# Patient Record
Sex: Female | Born: 1950 | Race: Black or African American | Hispanic: No | Smoking: Former smoker
Health system: Southern US, Community
[De-identification: ages and names within clinical notes are randomized; demographics above are authoritative.]

## PROBLEM LIST (undated history)

## (undated) ENCOUNTER — Emergency Department (HOSPITAL_COMMUNITY): Admission: EM | Payer: Self-pay | Source: Home / Self Care

## (undated) DIAGNOSIS — E7800 Pure hypercholesterolemia, unspecified: Secondary | ICD-10-CM

## (undated) DIAGNOSIS — I10 Essential (primary) hypertension: Secondary | ICD-10-CM

## (undated) DIAGNOSIS — M549 Dorsalgia, unspecified: Secondary | ICD-10-CM

## (undated) DIAGNOSIS — E079 Disorder of thyroid, unspecified: Secondary | ICD-10-CM

## (undated) DIAGNOSIS — M1990 Unspecified osteoarthritis, unspecified site: Secondary | ICD-10-CM

## (undated) DIAGNOSIS — O009 Unspecified ectopic pregnancy without intrauterine pregnancy: Secondary | ICD-10-CM

## (undated) HISTORY — PX: UNILATERAL SALPINGECTOMY: SHX6160

## (undated) HISTORY — PX: BREAST BIOPSY: SHX20

## (undated) HISTORY — PX: ECTOPIC PREGNANCY SURGERY: SHX613

## (undated) HISTORY — PX: TUBAL LIGATION: SHX77

---

## 1998-03-02 ENCOUNTER — Other Ambulatory Visit: Admission: RE | Admit: 1998-03-02 | Discharge: 1998-03-02 | Payer: Self-pay | Admitting: Internal Medicine

## 1998-05-08 ENCOUNTER — Other Ambulatory Visit: Admission: RE | Admit: 1998-05-08 | Discharge: 1998-05-08 | Payer: Self-pay | Admitting: Family Medicine

## 1998-05-16 ENCOUNTER — Ambulatory Visit (HOSPITAL_COMMUNITY): Admission: RE | Admit: 1998-05-16 | Discharge: 1998-05-16 | Payer: Self-pay | Admitting: Family Medicine

## 2003-06-07 ENCOUNTER — Ambulatory Visit (HOSPITAL_COMMUNITY): Admission: RE | Admit: 2003-06-07 | Discharge: 2003-06-07 | Payer: Self-pay | Admitting: General Practice

## 2003-06-07 ENCOUNTER — Encounter: Payer: Self-pay | Admitting: General Practice

## 2003-08-23 ENCOUNTER — Other Ambulatory Visit: Admission: RE | Admit: 2003-08-23 | Discharge: 2003-08-23 | Payer: Self-pay | Admitting: Internal Medicine

## 2005-04-24 ENCOUNTER — Ambulatory Visit: Payer: Self-pay | Admitting: Internal Medicine

## 2006-07-21 ENCOUNTER — Emergency Department (HOSPITAL_COMMUNITY): Admission: EM | Admit: 2006-07-21 | Discharge: 2006-07-21 | Payer: Self-pay | Admitting: Emergency Medicine

## 2007-06-11 ENCOUNTER — Encounter: Admission: RE | Admit: 2007-06-11 | Discharge: 2007-06-11 | Payer: Self-pay | Admitting: General Practice

## 2007-07-13 DIAGNOSIS — I10 Essential (primary) hypertension: Secondary | ICD-10-CM | POA: Insufficient documentation

## 2008-02-23 ENCOUNTER — Ambulatory Visit: Payer: Self-pay | Admitting: Obstetrics and Gynecology

## 2009-07-06 ENCOUNTER — Emergency Department (HOSPITAL_COMMUNITY): Admission: EM | Admit: 2009-07-06 | Discharge: 2009-07-06 | Payer: Self-pay | Admitting: Emergency Medicine

## 2011-02-07 LAB — POCT PREGNANCY, URINE: Preg Test, Ur: NEGATIVE

## 2011-02-07 LAB — POCT URINALYSIS DIP (DEVICE)
Specific Gravity, Urine: 1.025 (ref 1.005–1.030)
Urobilinogen, UA: 0.2 mg/dL (ref 0.0–1.0)

## 2011-02-07 LAB — GC/CHLAMYDIA PROBE AMP, GENITAL: Chlamydia, DNA Probe: NEGATIVE

## 2011-03-18 NOTE — Group Therapy Note (Signed)
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  I have talked to her about  the risk.  She started on folic acid on her own and I told her we would  defer this treatment, have her come back for a Pap smear here in 6  months.  If it is still CIN-1, then consider maybe cryosurgery, but I am  hoping that it would resolve by that time.  She seems to agree with this  plan and I am discharging her to come back in 6 months for Pap smear for  CIN-1.           ______________________________  Argentina Donovan, MD     PR/MEDQ  D:  02/23/2008  T:  02/23/2008  Job:  [id number]

## 2013-06-01 ENCOUNTER — Emergency Department (HOSPITAL_COMMUNITY): Payer: Self-pay

## 2013-06-01 ENCOUNTER — Emergency Department (HOSPITAL_COMMUNITY)
Admission: EM | Admit: 2013-06-01 | Discharge: 2013-06-01 | Disposition: A | Discharge status: Home / Self Care | Payer: No Typology Code available for payment source | Attending: Emergency Medicine | Admitting: Emergency Medicine

## 2013-06-01 ENCOUNTER — Encounter (HOSPITAL_COMMUNITY): Payer: Self-pay | Admitting: Emergency Medicine

## 2013-06-01 DIAGNOSIS — Y929 Unspecified place or not applicable: Secondary | ICD-10-CM | POA: Insufficient documentation

## 2013-06-01 DIAGNOSIS — M5431 Sciatica, right side: Secondary | ICD-10-CM

## 2013-06-01 DIAGNOSIS — M62830 Muscle spasm of back: Secondary | ICD-10-CM

## 2013-06-01 DIAGNOSIS — Y9389 Activity, other specified: Secondary | ICD-10-CM | POA: Insufficient documentation

## 2013-06-01 DIAGNOSIS — Z87891 Personal history of nicotine dependence: Secondary | ICD-10-CM | POA: Insufficient documentation

## 2013-06-01 DIAGNOSIS — S335XXA Sprain of ligaments of lumbar spine, initial encounter: Secondary | ICD-10-CM | POA: Insufficient documentation

## 2013-06-01 DIAGNOSIS — Z791 Long term (current) use of non-steroidal anti-inflammatories (NSAID): Secondary | ICD-10-CM | POA: Insufficient documentation

## 2013-06-01 DIAGNOSIS — M545 Low back pain: Secondary | ICD-10-CM

## 2013-06-01 DIAGNOSIS — Z79899 Other long term (current) drug therapy: Secondary | ICD-10-CM | POA: Insufficient documentation

## 2013-06-01 DIAGNOSIS — M5430 Sciatica, unspecified side: Secondary | ICD-10-CM | POA: Insufficient documentation

## 2013-06-01 DIAGNOSIS — I10 Essential (primary) hypertension: Secondary | ICD-10-CM | POA: Insufficient documentation

## 2013-06-01 DIAGNOSIS — M5380 Other specified dorsopathies, site unspecified: Secondary | ICD-10-CM | POA: Insufficient documentation

## 2013-06-01 DIAGNOSIS — S39012A Strain of muscle, fascia and tendon of lower back, initial encounter: Secondary | ICD-10-CM

## 2013-06-01 DIAGNOSIS — X503XXA Overexertion from repetitive movements, initial encounter: Secondary | ICD-10-CM | POA: Insufficient documentation

## 2013-06-01 DIAGNOSIS — E079 Disorder of thyroid, unspecified: Secondary | ICD-10-CM | POA: Insufficient documentation

## 2013-06-01 DIAGNOSIS — E7800 Pure hypercholesterolemia, unspecified: Secondary | ICD-10-CM | POA: Insufficient documentation

## 2013-06-01 HISTORY — DX: Disorder of thyroid, unspecified: E07.9

## 2013-06-01 HISTORY — DX: Essential (primary) hypertension: I10

## 2013-06-01 HISTORY — DX: Pure hypercholesterolemia, unspecified: E78.00

## 2013-06-01 MED ORDER — METHOCARBAMOL 750 MG PO TABS: 750.0000 mg | ORAL_TABLET | Freq: Four times a day (QID) | ORAL | Status: DC | PRN

## 2013-06-01 MED ORDER — OXYCODONE-ACETAMINOPHEN 5-325 MG PO TABS: 1.0000 | ORAL_TABLET | ORAL | Status: DC | PRN

## 2013-06-01 MED ORDER — PROMETHAZINE HCL 25 MG PO TABS: 25.0000 mg | ORAL_TABLET | Freq: Four times a day (QID) | ORAL | Status: DC | PRN

## 2013-06-01 NOTE — ED Notes (Signed)
Pt reports picking up her daughter from the floor a couple of days ago and injuring her lower back. States that lower back/sacral region sore, throbbing with spasms. Pt also reports intermittent bilateral leg pain for several months. States pain is sharp and shooting in sensation.

## 2013-06-01 NOTE — ED Notes (Signed)
PT. REPORTS LOWER BACK MUSCLE PAIN AND RIGHT UPPER THIGH PAIN ONSET 2 DAYS AGO AFTER HELPING SOMEBODY GET UP . RESPIRATIONS UNLABORED , AMBULATORY , NO URINARY SYMPTOMS.

## 2013-06-01 NOTE — ED Provider Notes (Signed)
CSN: [id number]     Arrival date & time 06/01/13  1922 History  This chart was scribed for Dierdre Forth, PA-C, working with Loren Racer, MD, by Ardelia Mems ED Scribe. This patient was seen in room TR07C/TR07C and the patient's care was started at 8:55 PM.   First MD Initiated Contact with Patient 06/01/13 2044     Chief Complaint  Patient presents with  . Back Pain    The history is provided by the patient. No language interpreter was used.   HPI Comments: Kimberly Blanchard is a 62 y.o. Female with a history of arthritis, hypertension, hypercholesteremia and hypothyroidism who presents to the Emergency Department complaining of constant, moderate lower back pain onset 2 days ago. She also states that she has pain in her buttocks. She states that her granddaughter fell 2 days ago, and she tried to help her daughter up when her pain onset suddenly while lifting and has progressed slowly. She states that her both of her buttocks hurt, but the right hurts worse than the left and her left hip also hurts. She states that the pain occasionally radiates down her right leg and states that she has a burning sensation in her knees. Pt states that she has not been evaluated for this. She states that she has taken Aspirin and Norco for the current pain without relief. She states that she normally takes Norco for arthritis. She also states that she has tried applying a heating pad to her back without relief. She also states that she has anxiety, for which she takes Xanax. She states that she drove to the ED. She is a former smoker and denies alcohol use. She denies neck pain, saddle anesthesia, bowel or bladder incontinence, weakness, numbness or any other symptoms.    Past Medical History  Diagnosis Date  . Hypertension   . Thyroid disease   . Hypercholesterolemia    Past Surgical History  Procedure Laterality Date  . Tubal ligation     No family history on file. History  Substance Use  Topics  . Smoking status: Former Games developer  . Smokeless tobacco: Not on file  . Alcohol Use: No   OB History   Grav Para Term Preterm Abortions TAB SAB Ect Mult Living                 Review of Systems  Constitutional: Negative for fever, diaphoresis, appetite change, fatigue and unexpected weight change.  HENT: Negative for mouth sores, neck pain and neck stiffness.   Eyes: Negative for visual disturbance.  Respiratory: Negative for cough, chest tightness, shortness of breath and wheezing.   Cardiovascular: Negative for chest pain.  Gastrointestinal: Negative for nausea, vomiting, abdominal pain, diarrhea and constipation.       Denies bowel incontinence.  Endocrine: Negative for polydipsia, polyphagia and polyuria.  Genitourinary: Negative for dysuria, urgency, frequency and hematuria.       Denies bladder incontinence.  Musculoskeletal: Positive for back pain.  Skin: Negative for rash.  Allergic/Immunologic: Negative for immunocompromised state.  Neurological: Negative for syncope, weakness, light-headedness, numbness and headaches.       Denies saddle anaesthesia.  Hematological: Does not bruise/bleed easily.  Psychiatric/Behavioral: Negative for sleep disturbance. The patient is not nervous/anxious.   All other systems reviewed and are negative.    Allergies  Review of patient's allergies indicates not on file.  Home Medications   Current Outpatient Rx  Name  Route  Sig  Dispense  Refill  . ALPRAZolam Prudy Feeler)  1 MG tablet   Oral   Take 1 mg by mouth 3 (three) times daily as needed for sleep.         Marland Kitchen HYDROcodone-acetaminophen (NORCO) 10-325 MG per tablet   Oral   Take 1 tablet by mouth every 6 (six) hours as needed for pain.         . Iodine, Kelp, (KELP PO)   Oral   Take 1 tablet by mouth daily.         Marland Kitchen levothyroxine (SYNTHROID, LEVOTHROID) 50 MCG tablet   Oral   Take 50 mcg by mouth daily before breakfast.         . lovastatin (MEVACOR) 10 MG  tablet   Oral   Take 10 mg by mouth at bedtime.         . meloxicam (MOBIC) 7.5 MG tablet   Oral   Take 7.5 mg by mouth 2 (two) times daily.         Marland Kitchen triamterene-hydrochlorothiazide (MAXZIDE) 75-50 MG per tablet   Oral   Take 0.5 tablets by mouth daily.          Triage Vitals: BP 130/79  Pulse 64  Temp(Src) 97.8 F (36.6 C) (Oral)  Resp 16  SpO2 99%  Physical Exam  Nursing note and vitals reviewed. Constitutional: She is oriented to person, place, and time. She appears well-developed and well-nourished. No distress.  HENT:  Head: Normocephalic and atraumatic.  Mouth/Throat: Oropharynx is clear and moist. No oropharyngeal exudate.  Eyes: Conjunctivae are normal. Pupils are equal, round, and reactive to light.  Neck: Normal range of motion. Neck supple.  Full ROM without pain  Cardiovascular: Normal rate, regular rhythm, normal heart sounds and intact distal pulses.   No murmur heard. Pulmonary/Chest: Effort normal and breath sounds normal. No respiratory distress. She has no wheezes.  Abdominal: Soft. Bowel sounds are normal. She exhibits no distension. There is no tenderness.  Musculoskeletal: Normal range of motion. She exhibits tenderness. She exhibits no edema.  Full range of motion of the C-spine, T-spine and L-spine No tenderness to palpation of the spinous processes of the T-spine or L-spine Mild tenderness to palpation of the paraspinous muscles of the L-spine Reproducible sciatic symptoms with palpation of the right buttock  Lymphadenopathy:    She has no cervical adenopathy.  Neurological: She is alert and oriented to person, place, and time. She has normal reflexes. She exhibits normal muscle tone. Coordination normal.  Speech is clear and goal oriented, follows commands Normal strength in upper and lower extremities bilaterally including dorsiflexion and plantar flexion, strong and equal grip strength Sensation normal to light and sharp touch Moves  extremities without ataxia, coordination intact Normal gait Normal balance   Skin: Skin is warm and dry. No rash noted. She is not diaphoretic. No erythema.  Psychiatric: She has a normal mood and affect. Her behavior is normal.    ED Course   Procedures (including critical care time)  DIAGNOSTIC STUDIES: Oxygen Saturation is 99% on RA, normal by my interpretation.    COORDINATION OF CARE: 8:56 PM- Pt advised of plan for treatment and pt agrees.   Labs Reviewed - No data to display  Dg Lumbar Spine Complete  06/01/2013   *RADIOLOGY REPORT*  Clinical Data: Lifting injury with low back pain.  LUMBAR SPINE - COMPLETE 4+ VIEW  Comparison: None.  Findings: The lumbar spine shows normal alignment and no evidence of fracture or subluxation.  Facet hypertrophy is present at L3-4, L4-5  and L5-S1.  No bony lesions are seen.  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  No evidence of fracture.   Original Report Authenticated By: Irish Lack, M.D.   1. Back muscle spasm   2. Low back pain   3. Sciatica, right [724.3]   4. Strain of lumbar region, initial encounter [847.2]     MDM  Kreg Shropshire presents with low back strain and sciatica.  Normal neurological exam, no evidence of urinary incontinence or retention, pain is consistently reproducible. There is no evidence of AAA or concern for dissection at this time.   Patient can walk but states is painful.  No loss of bowel or bladder control.  No concern for cauda equina.  No fever, night sweats, weight loss, h/o cancer, IVDU.  RICE protocol and pain medicine indicated and discussed with patient. Pt already taking Meloxicam and Norco.  Will change Norco to Percocet and add Robaxin.  I have also discussed reasons to return immediately to the ER.  Patient expresses understanding and agrees with plan.    I personally performed the services described in this documentation, which was scribed in my presence. The recorded  information has been reviewed and is accurate.    Dahlia Client Kenyata Guess, PA-C 06/01/13 2218

## 2013-06-02 NOTE — ED Provider Notes (Signed)
Medical screening examination/treatment/procedure(s) were performed by non-physician practitioner and as supervising physician I was immediately available for consultation/collaboration.   Loraina Stauffer, MD 06/02/13 1754 

## 2013-06-11 ENCOUNTER — Encounter (HOSPITAL_COMMUNITY): Payer: Self-pay | Admitting: *Deleted

## 2013-06-11 ENCOUNTER — Emergency Department (INDEPENDENT_AMBULATORY_CARE_PROVIDER_SITE_OTHER)
Admission: EM | Admit: 2013-06-11 | Discharge: 2013-06-11 | Disposition: A | Discharge status: Home / Self Care | Payer: Self-pay | Source: Home / Self Care

## 2013-06-11 DIAGNOSIS — M542 Cervicalgia: Secondary | ICD-10-CM

## 2013-06-11 DIAGNOSIS — M549 Dorsalgia, unspecified: Secondary | ICD-10-CM

## 2013-06-11 HISTORY — DX: Dorsalgia, unspecified: M54.9

## 2013-06-11 HISTORY — DX: Unspecified osteoarthritis, unspecified site: M19.90

## 2013-06-11 HISTORY — DX: Unspecified ectopic pregnancy without intrauterine pregnancy: O00.90

## 2013-06-11 MED ORDER — NAPROXEN 500 MG PO TABS: 500.0000 mg | ORAL_TABLET | Freq: Two times a day (BID) | ORAL | Status: DC
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
## 2013-06-13 NOTE — ED Provider Notes (Signed)
Medical screening examination/treatment/procedure(s) were performed by a resident physician or non-physician practitioner and as the supervising physician I was immediately available for consultation/collaboration.  Xander Jutras, MD   Mercedes Valeriano S Isack Lavalley, MD 06/13/13 0956 

## 2013-08-18 ENCOUNTER — Other Ambulatory Visit: Payer: Self-pay

## 2013-09-01 ENCOUNTER — Ambulatory Visit: Payer: Self-pay

## 2013-09-02 ENCOUNTER — Ambulatory Visit: Payer: No Typology Code available for payment source | Attending: Internal Medicine

## 2013-10-07 ENCOUNTER — Ambulatory Visit: Payer: No Typology Code available for payment source | Attending: Internal Medicine | Admitting: Internal Medicine

## 2013-10-07 ENCOUNTER — Encounter: Payer: Self-pay | Admitting: Internal Medicine

## 2013-10-07 VITALS — BP 137/84 | HR 74 | Temp 98.8°F | Resp 14 | Ht 65.0 in | Wt 166.0 lb

## 2013-10-07 DIAGNOSIS — Z7189 Other specified counseling: Secondary | ICD-10-CM

## 2013-10-07 DIAGNOSIS — I10 Essential (primary) hypertension: Secondary | ICD-10-CM | POA: Insufficient documentation

## 2013-10-07 DIAGNOSIS — M542 Cervicalgia: Secondary | ICD-10-CM | POA: Insufficient documentation

## 2013-10-07 DIAGNOSIS — F411 Generalized anxiety disorder: Secondary | ICD-10-CM | POA: Insufficient documentation

## 2013-10-07 DIAGNOSIS — Z7689 Persons encountering health services in other specified circumstances: Secondary | ICD-10-CM

## 2013-10-07 LAB — COMPLETE METABOLIC PANEL WITH GFR
ALT: 14 U/L (ref 0–35)
AST: 19 U/L (ref 0–37)
CO2: 27 mEq/L (ref 19–32)
GFR, Est African American: 77 mL/min
Sodium: 140 mEq/L (ref 135–145)
Total Bilirubin: 0.3 mg/dL (ref 0.3–1.2)
Total Protein: 7.2 g/dL (ref 6.0–8.3)

## 2013-10-07 LAB — LIPID PANEL
HDL: 57 mg/dL (ref >39)
LDL Cholesterol: 112 mg/dL — ABNORMAL HIGH (ref 0–99)
Total CHOL/HDL Ratio: 3.4 Ratio
Triglycerides: 131 mg/dL (ref <150)

## 2013-10-07 LAB — CBC WITH DIFFERENTIAL/PLATELET
Eosinophils Absolute: 0.3 10*3/uL (ref 0.0–0.7)
Eosinophils Relative: 5 % (ref 0–5)
HCT: 36.9 % (ref 36.0–46.0)
Hemoglobin: 12.6 g/dL (ref 12.0–15.0)
Lymphocytes Relative: 34 % (ref 12–46)
Lymphs Abs: 1.9 10*3/uL (ref 0.7–4.0)
MCH: 27.2 pg (ref 26.0–34.0)
MCV: 79.5 fL (ref 78.0–100.0)
Monocytes Absolute: 0.4 10*3/uL (ref 0.1–1.0)
Monocytes Relative: 7 % (ref 3–12)
Platelets: 286 10*3/uL (ref 150–400)
RBC: 4.64 MIL/uL (ref 3.87–5.11)

## 2013-10-07 LAB — SEDIMENTATION RATE: Sed Rate: 28 mm/hr — ABNORMAL HIGH (ref 0–22)

## 2013-10-07 LAB — TSH: TSH: 7.8 u[IU]/mL — ABNORMAL HIGH (ref 0.350–4.500)

## 2013-10-07 MED ORDER — LOVASTATIN 10 MG PO TABS: 10.0000 mg | ORAL_TABLET | Freq: Every day | ORAL | Status: DC

## 2013-10-07 MED ORDER — GABAPENTIN 100 MG PO CAPS: 100.0000 mg | ORAL_CAPSULE | Freq: Three times a day (TID) | ORAL | Status: DC

## 2013-10-07 MED ORDER — LEVOTHYROXINE SODIUM 50 MCG PO TABS: 50.0000 ug | ORAL_TABLET | Freq: Every day | ORAL | Status: DC

## 2013-10-07 MED ORDER — PREDNISONE 20 MG PO TABS: 40.0000 mg | ORAL_TABLET | Freq: Every day | ORAL | Status: AC

## 2013-10-07 MED ORDER — METHOCARBAMOL 750 MG PO TABS: 750.0000 mg | ORAL_TABLET | Freq: Four times a day (QID) | ORAL | Status: DC | PRN

## 2013-10-07 MED ORDER — TRIAMTERENE-HCTZ 75-50 MG PO TABS: 0.5000 | ORAL_TABLET | Freq: Every day | ORAL | Status: DC

## 2013-10-07 MED ORDER — ALPRAZOLAM 1 MG PO TABS: 1.0000 mg | ORAL_TABLET | Freq: Three times a day (TID) | ORAL | Status: DC | PRN
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
## 2013-10-11 ENCOUNTER — Other Ambulatory Visit: Payer: Self-pay | Admitting: Internal Medicine

## 2013-10-11 ENCOUNTER — Telehealth: Payer: Self-pay | Admitting: Emergency Medicine

## 2013-10-11 DIAGNOSIS — M542 Cervicalgia: Secondary | ICD-10-CM

## 2013-10-11 MED ORDER — LEVOTHYROXINE SODIUM 25 MCG PO TABS: 62.5000 ug | ORAL_TABLET | Freq: Every day | ORAL | Status: DC

## 2013-10-11 NOTE — Telephone Encounter (Signed)
Message copied by Darlis Loan on Tue Oct 11, 2013  3:02 PM ------      Message from: Susie Cassette MD, Germain Osgood      Created: Tue Oct 11, 2013  2:20 PM       Please notify patient that all labs were normal with the exception of TSH which was mildly elevated, the patient's levothyroxine dose has been changed to 62.5 mg and she should pick up a new prescription. She will need a repeat thyroid function test in about 2 months ------

## 2013-10-11 NOTE — Progress Notes (Signed)
Lab results given. Pt very concerned with elevated TSH. States since taking new Levothyroxine,she noticed higher levels. Scheduled f/u repeat appt in 2 mnths TSH,T3-T4 . Pt also scheduled for f/u appt with provider

## 2013-10-12 ENCOUNTER — Other Ambulatory Visit: Payer: Self-pay | Admitting: Emergency Medicine

## 2013-10-12 DIAGNOSIS — K0889 Other specified disorders of teeth and supporting structures: Secondary | ICD-10-CM

## 2013-10-17 ENCOUNTER — Ambulatory Visit: Payer: Self-pay

## 2013-10-20 ENCOUNTER — Ambulatory Visit: Payer: No Typology Code available for payment source | Admitting: Internal Medicine

## 2013-10-24 ENCOUNTER — Ambulatory Visit (HOSPITAL_COMMUNITY): Admission: RE | Admit: 2013-10-24 | Payer: No Typology Code available for payment source | Source: Ambulatory Visit

## 2013-11-02 ENCOUNTER — Ambulatory Visit (HOSPITAL_COMMUNITY)
Admission: RE | Admit: 2013-11-02 | Discharge: 2013-11-02 | Disposition: A | Discharge status: Home / Self Care | Payer: No Typology Code available for payment source | Source: Ambulatory Visit | Attending: Internal Medicine | Admitting: Internal Medicine

## 2013-11-02 DIAGNOSIS — M542 Cervicalgia: Secondary | ICD-10-CM | POA: Insufficient documentation

## 2013-11-02 DIAGNOSIS — M47812 Spondylosis without myelopathy or radiculopathy, cervical region: Secondary | ICD-10-CM | POA: Insufficient documentation

## 2013-11-07 ENCOUNTER — Other Ambulatory Visit: Payer: Self-pay | Admitting: Internal Medicine

## 2013-11-14 ENCOUNTER — Telehealth: Payer: Self-pay | Admitting: General Practice

## 2013-11-14 NOTE — Telephone Encounter (Signed)
Pt calling about results for MRI, please f/u with pt.

## 2013-11-15 ENCOUNTER — Telehealth: Payer: Self-pay | Admitting: *Deleted

## 2013-11-15 NOTE — Telephone Encounter (Signed)
Pt went to our pharmacy demanding phenergan before her appointment.

## 2013-11-16 ENCOUNTER — Ambulatory Visit: Payer: No Typology Code available for payment source

## 2013-11-21 ENCOUNTER — Telehealth: Payer: Self-pay

## 2013-11-21 MED ORDER — PROMETHAZINE HCL 25 MG PO TABS: 25.0000 mg | ORAL_TABLET | Freq: Four times a day (QID) | ORAL | Status: DC | PRN

## 2013-11-21 NOTE — Telephone Encounter (Signed)
Spoke with patient  She states she takes phenergan because her medications upset her stomach [facility]

## 2013-11-24 ENCOUNTER — Ambulatory Visit: Payer: No Typology Code available for payment source | Attending: Internal Medicine | Admitting: Internal Medicine

## 2013-11-24 ENCOUNTER — Encounter: Payer: Self-pay | Admitting: Internal Medicine

## 2013-11-24 VITALS — BP 106/68 | HR 80 | Temp 98.5°F | Resp 16 | Ht 65.0 in | Wt 165.0 lb

## 2013-11-24 DIAGNOSIS — E039 Hypothyroidism, unspecified: Secondary | ICD-10-CM

## 2013-11-24 DIAGNOSIS — N644 Mastodynia: Secondary | ICD-10-CM

## 2013-11-24 DIAGNOSIS — Z0000 Encounter for general adult medical examination without abnormal findings: Secondary | ICD-10-CM

## 2013-11-24 MED ORDER — PROMETHAZINE HCL 25 MG PO TABS: 25.0000 mg | ORAL_TABLET | Freq: Four times a day (QID) | ORAL | Status: DC | PRN

## 2013-11-24 NOTE — Progress Notes (Unsigned)
Pt here for MRI results and to adjust thyroid medication PT need T3-T4 lab work Pt need health maintenance pap smear,mammogram and colonoscopy Will get flu vaccine

## 2013-11-24 NOTE — Progress Notes (Unsigned)
Patient ID: Kimberly Blanchard, female   DOB: 06/09/1951, 63 y.o.   MRN: [id number]   CC:  HPI: 63 year old female here for a follow up to discuss the results of her MRI C-spine as well as her labs. She states that her neck pain is much better than the current pain regimen She now complains of right inguinal pain, which is intermittent, start while she is walking, limited to only a few seconds, she denies any numbness or tingling in her legs in the stool or urinary incontinence. She is interested in trying physical therapy to strengthen the muscles in her back  She is also requesting a mammogram, Pap smear, routine colonoscopy    No Known Allergies Past Medical History  Diagnosis Date  . Hypertension   . Thyroid disease   . Hypercholesterolemia   . Arthritis   . Back pain   . Ectopic pregnancy    Current Outpatient Prescriptions on File Prior to Visit  Medication Sig Dispense Refill  . ALPRAZolam (XANAX) 1 MG tablet Take 1 tablet (1 mg total) by mouth 3 (three) times daily as needed for sleep.  30 tablet  0  . gabapentin (NEURONTIN) 100 MG capsule Take 1 capsule (100 mg total) by mouth 3 (three) times daily.  90 capsule  3  . HYDROcodone-acetaminophen (NORCO) 10-325 MG per tablet Take 1 tablet by mouth every 6 (six) hours as needed for pain.      Marland Kitchen levothyroxine (LEVOTHROID) 25 MCG tablet Take 2.5 tablets (62.5 mcg total) by mouth daily before breakfast.  60 tablet  10  . lovastatin (MEVACOR) 10 MG tablet Take 1 tablet (10 mg total) by mouth at bedtime.  30 tablet  2  . methocarbamol (ROBAXIN) 750 MG tablet TAKE 1 TABLET BY MOUTH EVERY 6 HOURS AS NEEDED FOR MUSCLE SPASMS.  20 tablet  0  . triamterene-hydrochlorothiazide (MAXZIDE) 75-50 MG per tablet Take 0.5 tablets by mouth daily.  30 tablet  3  . Iodine, Kelp, (KELP PO) Take 1 tablet by mouth daily.      Marland Kitchen oxyCODONE-acetaminophen (PERCOCET) 5-325 MG per tablet Take 1 tablet by mouth every 4 (four) hours as needed for pain (Take 1- 2  tablets every 4 - 6 hours as needed for pain.).  15 tablet  0   No current facility-administered medications on file prior to visit.   Family History  Problem Relation Age of Onset  . Diabetes Mother   . Hypertension Mother   . Diabetes Sister   . Hypertension Sister   . Hypertension Maternal Grandmother    History   Social History  . Marital Status: Married    Spouse Name: N/A    Number of Children: N/A  . Years of Education: N/A   Occupational History  . Not on file.   Social History Main Topics  . Smoking status: Former Games developer  . Smokeless tobacco: Not on file  . Alcohol Use: No  . Drug Use: No  . Sexual Activity: Not on file   Other Topics Concern  . Not on file   Social History Narrative  . No narrative on file    Review of Systems  Constitutional: Negative for fever, chills, diaphoresis, activity change, appetite change and fatigue.  HENT: Negative for ear pain, nosebleeds, congestion, facial swelling, rhinorrhea, neck pain, neck stiffness and ear discharge.   Eyes: Negative for pain, discharge, redness, itching and visual disturbance.  Respiratory: Negative for cough, choking, chest tightness, shortness of breath, wheezing and stridor.  Cardiovascular: Negative for chest pain, palpitations and leg swelling.  Gastrointestinal: Negative for abdominal distention.  Genitourinary: Negative for dysuria, urgency, frequency, hematuria, flank pain, decreased urine volume, difficulty urinating and dyspareunia.  Musculoskeletal: Negative for back pain, joint swelling, arthralgias and gait problem.  Neurological: Negative for dizziness, tremors, seizures, syncope, facial asymmetry, speech difficulty, weakness, light-headedness, numbness and headaches.  Hematological: Negative for adenopathy. Does not bruise/bleed easily.  Psychiatric/Behavioral: Negative for hallucinations, behavioral problems, confusion, dysphoric mood, decreased concentration and agitation.     Objective:   Filed Vitals:   11/24/13 1649  BP: 106/68  Pulse: 80  Temp: 98.5 F (36.9 C)  Resp: 16    Physical Exam  Constitutional: Appears well-developed and well-nourished. No distress.  HENT: Normocephalic. External right and left ear normal. Oropharynx is clear and moist.  Eyes: Conjunctivae and EOM are normal. PERRLA, no scleral icterus.  Neck: Normal ROM. Neck supple. No JVD. No tracheal deviation. No thyromegaly.  CVS: RRR, S1/S2 +, no murmurs, no gallops, no carotid bruit.  Pulmonary: Effort and breath sounds normal, no stridor, rhonchi, wheezes, rales.  Abdominal: Soft. BS +,  no distension, tenderness, rebound or guarding.  Musculoskeletal: Normal range of motion. No edema and no tenderness.  Lymphadenopathy: No lymphadenopathy noted, cervical, inguinal. Neuro: Alert. Normal reflexes, muscle tone coordination. No cranial nerve deficit. Skin: Skin is warm and dry. No rash noted. Not diaphoretic. No erythema. No pallor.  Psychiatric: Normal mood and affect. Behavior, judgment, thought content normal.   Lab Results  Component Value Date   WBC 5.7 10/07/2013   HGB 12.6 10/07/2013   HCT 36.9 10/07/2013   MCV 79.5 10/07/2013   PLT 286 10/07/2013   Lab Results  Component Value Date   CREATININE 0.92 10/07/2013   BUN 15 10/07/2013   NA 140 10/07/2013   K 4.0 10/07/2013   CL 105 10/07/2013   CO2 27 10/07/2013    No results found for this basename: HGBA1C   Lipid Panel     Component Value Date/Time   CHOL 195 10/07/2013 1746   TRIG 131 10/07/2013 1746   HDL 57 10/07/2013 1746   CHOLHDL 3.4 10/07/2013 1746   VLDL 26 10/07/2013 1746   LDLCALC 112* 10/07/2013 1746       Assessment and plan:   Patient Active Problem List   Diagnosis Date Noted  . HYPERTENSION 07/13/2007   Back pain Physical therapy referral provided Continue current regimen of pain  Establish care Mammogram GI referral for a colonoscopy Gynecology referral for a Pap smear Follow up in 2  months      The patient was given clear instructions to go to ER or return to medical center if symptoms don't improve, worsen or new problems develop. The patient verbalized understanding. The patient was told to call to get any lab results if not heard anything in the next week.

## 2013-11-25 LAB — TSH: TSH: 4.949 u[IU]/mL — AB (ref 0.350–4.500)

## 2013-11-25 LAB — T4, FREE: FREE T4: 1.14 ng/dL (ref 0.80–1.80)

## 2013-11-29 ENCOUNTER — Telehealth: Payer: Self-pay | Admitting: *Deleted

## 2013-11-29 ENCOUNTER — Encounter: Payer: Self-pay | Admitting: Obstetrics & Gynecology

## 2013-11-29 NOTE — Telephone Encounter (Signed)
Message copied by Dallis Czaja, [place] on Tue Nov 29, 2013 12:00 PM ------      Message from: Susie CassetteABROL MD, [facility]      Created: Mon Nov 28, 2013  2:07 PM       Thyroid function is normal ------

## 2013-11-30 ENCOUNTER — Encounter: Payer: Self-pay | Admitting: Internal Medicine

## 2013-12-05 ENCOUNTER — Ambulatory Visit: Payer: No Typology Code available for payment source | Attending: Internal Medicine

## 2013-12-05 DIAGNOSIS — Z23 Encounter for immunization: Secondary | ICD-10-CM

## 2013-12-09 ENCOUNTER — Ambulatory Visit: Payer: No Typology Code available for payment source | Attending: Internal Medicine

## 2013-12-12 ENCOUNTER — Ambulatory Visit (AMBULATORY_SURGERY_CENTER): Payer: Self-pay | Admitting: *Deleted

## 2013-12-12 VITALS — Ht 65.0 in | Wt 164.0 lb

## 2013-12-12 DIAGNOSIS — Z1211 Encounter for screening for malignant neoplasm of colon: Secondary | ICD-10-CM

## 2013-12-12 MED ORDER — MOVIPREP 100 G PO SOLR: ORAL | Status: DC

## 2013-12-12 NOTE — Progress Notes (Signed)
Patient denies any allergies to eggs or soy. Patient denies any problems with anesthesia.  

## 2013-12-13 ENCOUNTER — Ambulatory Visit
Admission: RE | Admit: 2013-12-13 | Discharge: 2013-12-13 | Disposition: A | Discharge status: Home / Self Care | Payer: No Typology Code available for payment source | Source: Ambulatory Visit | Attending: Internal Medicine | Admitting: Internal Medicine

## 2013-12-13 ENCOUNTER — Other Ambulatory Visit: Payer: Self-pay | Admitting: Internal Medicine

## 2013-12-13 ENCOUNTER — Telehealth: Payer: Self-pay | Admitting: *Deleted

## 2013-12-13 DIAGNOSIS — Z1231 Encounter for screening mammogram for malignant neoplasm of breast: Secondary | ICD-10-CM

## 2013-12-13 DIAGNOSIS — E039 Hypothyroidism, unspecified: Secondary | ICD-10-CM

## 2013-12-13 DIAGNOSIS — N644 Mastodynia: Secondary | ICD-10-CM

## 2013-12-13 DIAGNOSIS — Z0000 Encounter for general adult medical examination without abnormal findings: Secondary | ICD-10-CM

## 2013-12-13 NOTE — Telephone Encounter (Signed)
Message copied by Amyah Clawson, [place] on Tue Dec 13, 2013  3:30 PM ------      Message from: Susie CassetteABROL MD, [facility]      Created: Tue Dec 13, 2013  2:58 PM       Notify patient that date meanwhile  mammogram is negative ------

## 2013-12-16 ENCOUNTER — Other Ambulatory Visit: Payer: Self-pay

## 2013-12-26 ENCOUNTER — Encounter: Payer: Self-pay | Admitting: Internal Medicine

## 2013-12-26 ENCOUNTER — Ambulatory Visit (AMBULATORY_SURGERY_CENTER): Payer: No Typology Code available for payment source | Admitting: Internal Medicine

## 2013-12-26 VITALS — BP 125/74 | HR 73 | Temp 97.9°F | Resp 34 | Ht 65.0 in | Wt 164.0 lb

## 2013-12-26 DIAGNOSIS — Z1211 Encounter for screening for malignant neoplasm of colon: Secondary | ICD-10-CM

## 2013-12-26 MED ORDER — SODIUM CHLORIDE 0.9 % IV SOLN: 500.0000 mL | INTRAVENOUS | Status: DC

## 2013-12-26 NOTE — Progress Notes (Signed)
Procedure ens, to recovery, report given and VSS

## 2013-12-26 NOTE — Op Note (Signed)
[facility] [address] [place], [id number]   COLONOSCOPY PROCEDURE REPORT  PATIENT: Verner MouldBeard, Kimberly S.  MR#: [id number] BIRTHDATE: 06-Jun-1951 , 62  yrs. old GENDER: Female ENDOSCOPIST: Roxy CedarJohn N Walida Cajas Jr, MD REFERRED XB:JYNWGNBY:Nayana Abrol, M.D. [facility]) PROCEDURE DATE:  12/26/2013 PROCEDURE:   Colonoscopy, screening First Screening Colonoscopy - Avg.  risk and is 50 yrs.  old or older Yes.  Prior Negative Screening - Now for repeat screening. N/A  History of Adenoma - Now for follow-up colonoscopy & has been > or = to 3 yrs.  N/A  Polyps Removed Today? No.  Recommend repeat exam, <10 yrs? No. ASA CLASS:   Class II INDICATIONS:average risk screening. MEDICATIONS: MAC sedation, administered by CRNA and propofol (Diprivan) 160mg  IV  DESCRIPTION OF PROCEDURE:   After the risks benefits and alternatives of the procedure were thoroughly explained, informed consent was obtained.  A digital rectal exam revealed no abnormalities of the rectum.   The LB [id number]  endoscope was introduced through the anus and advanced to the cecum, which was identified by both the appendix and ileocecal valve. No adverse events experienced.   The quality of the prep was excellent, using MoviPrep  The instrument was then slowly withdrawn as the colon was fully examined.      COLON FINDINGS: A normal appearing cecum, ileocecal valve, and appendiceal orifice were identified.  The ascending, hepatic flexure, transverse, splenic flexure, descending, sigmoid colon and rectum appeared unremarkable.  No polyps or cancers were seen. Retroflexed views revealed no abnormalities. The time to cecum=2 minutes 17 seconds.  Withdrawal time=6 minutes 52 seconds.  The scope was withdrawn and the procedure completed.  COMPLICATIONS: There were no complications.  ENDOSCOPIC IMPRESSION: 1. Normal colon  RECOMMENDATIONS: 1. Continue current colorectal screening recommendations  for "routine risk" patients with a repeat colonoscopy in 10 years.   eSigned:  Roxy CedarJohn N Jazma Pickel Jr, MD 12/26/2013 3:06 PM   cc: The Patient    ; Richarda OverlieNayana Abrol, MD ([facility])

## 2013-12-26 NOTE — Patient Instructions (Signed)
YOU HAD AN ENDOSCOPIC PROCEDURE TODAY AT THE [facility]: Refer to the procedure report that was given to you for any specific questions about what was found during the examination.  If the procedure report does not answer your questions, please call your gastroenterologist to clarify.  If you requested that your care partner not be given the details of your procedure findings, then the procedure report has been included in a sealed envelope for you to review at your convenience later.  YOU SHOULD EXPECT: Some feelings of bloating in the abdomen. Passage of more gas than usual.  Walking can help get rid of the air that was put into your GI tract during the procedure and reduce the bloating. If you had a lower endoscopy (such as a colonoscopy or flexible sigmoidoscopy) you may notice spotting of blood in your stool or on the toilet paper. If you underwent a bowel prep for your procedure, then you may not have a normal bowel movement for a few days.  DIET: Your first meal following the procedure should be a light meal and then it is ok to progress to your normal diet.  A half-sandwich or bowl of soup is an example of a good first meal.  Heavy or fried foods are harder to digest and may make you feel nauseous or bloated.  Likewise meals heavy in dairy and vegetables can cause extra gas to form and this can also increase the bloating.  Drink plenty of fluids but you should avoid alcoholic beverages for 24 hours.  ACTIVITY: Your care partner should take you home directly after the procedure.  You should plan to take it easy, moving slowly for the rest of the day.  You can resume normal activity the day after the procedure however you should NOT DRIVE or use heavy machinery for 24 hours (because of the sedation medicines used during the test).    SYMPTOMS TO REPORT IMMEDIATELY: A gastroenterologist can be reached at any hour.  During normal business hours, 8:30 AM to 5:00 PM Monday through Friday,  call [phone].  After hours and on weekends, please call the GI answering service at [phone] who will take a message and have the physician on call contact you.   Following lower endoscopy (colonoscopy or flexible sigmoidoscopy):  Excessive amounts of blood in the stool  Significant tenderness or worsening of abdominal pains  Swelling of the abdomen that is new, acute  Fever of 100F or higher    FOLLOW UP: If any biopsies were taken you will be contacted by phone or by letter within the next 1-3 weeks.  Call your gastroenterologist if you have not heard about the biopsies in 3 weeks.  Our staff will call the home number listed on your records the next business day following your procedure to check on you and address any questions or concerns that you may have at that time regarding the information given to you following your procedure. This is a courtesy call and so if there is no answer at the home number and we have not heard from you through the emergency physician on call, we will assume that you have returned to your regular daily activities without incident.  SIGNATURES/CONFIDENTIALITY: You and/or your care partner have signed paperwork which will be entered into your electronic medical record.  These signatures attest to the fact that that the information above on your After Visit Summary has been reviewed and is understood.  Full responsibility of the confidentiality   of this discharge information lies with you and/or your care-partner.     

## 2013-12-27 ENCOUNTER — Telehealth: Payer: Self-pay

## 2013-12-27 NOTE — Telephone Encounter (Signed)
No answer, left message to call [facility] if questions or concerns following procedure on Monday. 

## 2014-01-02 NOTE — Telephone Encounter (Signed)
addressed

## 2014-01-05 ENCOUNTER — Ambulatory Visit (INDEPENDENT_AMBULATORY_CARE_PROVIDER_SITE_OTHER): Payer: No Typology Code available for payment source | Admitting: Obstetrics & Gynecology

## 2014-01-05 ENCOUNTER — Encounter: Payer: Self-pay | Admitting: Obstetrics & Gynecology

## 2014-01-05 VITALS — BP 114/71 | HR 76 | Temp 98.1°F | Ht 65.0 in | Wt 164.6 lb

## 2014-01-05 DIAGNOSIS — Z01419 Encounter for gynecological examination (general) (routine) without abnormal findings: Secondary | ICD-10-CM

## 2014-01-05 NOTE — Progress Notes (Deleted)
Subjective:     Patient ID: Kimberly Blanchard, female   DOB: 08-13-1951, 63 y.o.   MRN: [id number]  HPI   Review of Systems     Objective:   Physical Exam BP 114/71  Pulse 76  Temp(Src) 98.1 F (36.7 C) (Oral)  Ht 5\' 5"  (1.651 m)  Wt 164 lb 9.6 oz (74.662 kg)  BMI 27.39 kg/m2      Assessment:     ***    Plan:     ***

## 2014-01-05 NOTE — Progress Notes (Signed)
Patient ID: Kimberly BectonSalinda Creech, female   DOB: 11-24-1950, 63 y.o.   MRN: [id number] Subjective:     Kimberly BectonSalinda Castrillon is a 63 y.o. female here for a routine exam.  Current complaints: none.     Gynecologic History No LMP recorded. Patient is postmenopausal. Contraception: post menopausal status Last Pap: 2011. Results were: normal Last mammogram: 01/2014. Results were: normal Colonoscopy 12/26/2013- normal  Obstetric History OB History  Gravida Para Term Preterm AB SAB TAB Ectopic Multiple Living  8 6 6  0 2 1 0 1 0 6    # Outcome Date GA Lbr Len/2nd Weight Sex Delivery Anes PTL Lv  8 SAB           7 ECT           6 TRM           5 TRM           4 TRM           3 TRM           2 TRM           1 TRM              Past Surgical History  Procedure Laterality Date  . Tubal ligation    . Ectopic pregnancy surgery    . Unilateral salpingectomy      R/T ectopic pregnancy   Past Medical History  Diagnosis Date  . Hypertension   . Thyroid disease   . Hypercholesterolemia   . Arthritis   . Back pain   . Ectopic pregnancy     The following portions of the patient's history were reviewed and updated as appropriate: allergies, current medications, past family history, past medical history, past social history, past surgical history and problem list.  Review of Systems Pertinent items are noted in HPI.    Objective:    BP 114/71  Pulse 76  Temp(Src) 98.1 F (36.7 C) (Oral)  Ht 5\' 5"  (1.651 m)  Wt 164 lb 9.6 oz (74.662 kg)  BMI 27.39 kg/m2  General Appearance:    Alert, cooperative, no distress, appears stated age  Head:    Normocephalic, without obvious abnormality, atraumatic              Neck:   Supple, symmetrical, trachea midline, no adenopathy;    thyroid:  no enlargement/tenderness/nodules; no carotid   bruit or JVD  Back:     Symmetric, no curvature, ROM normal, no CVA tenderness  Lungs:     Clear to auscultation bilaterally, respirations unlabored  Chest Wall:    No  tenderness or deformity   Heart:    Regular rate and rhythm, S1 and S2 normal, no murmur, rub   or gallop  Breast Exam:    No tenderness, masses, or nipple abnormality  Abdomen:     Soft, non-tender, bowel sounds active all four quadrants,    no masses, no organomegaly  Genitalia:    Normal female without lesion, discharge or tenderness     Extremities:   Extremities normal, atraumatic, no cyanosis or edema  Pulses:   2+ and symmetric all extremities               Assessment:    Healthy female exam.  Normal exam   Plan:    Follow up in: 1 year.   F/u PAP and HPV

## 2014-01-05 NOTE — Patient Instructions (Signed)
HPV Test The HPV (human papillomavirus) test is used to screen for high-risk types with HPV infection. HPV is a group of about 100 related viruses, of which 40 types are genital viruses. Most HPV viruses cause infections that usually resolve without treatment within 2 years. Some HPV infections can cause skin and genital warts (condylomata). HPV types 16, 18, 31 and 45 are considered high-risk types of HPV. High-risk types of HPV do not usually cause visible warts, but if untreated, may lead to cancers of the outlet of the womb (cervix) or anus. An HPV test identifies the DNA (genetic) strands of the HPV infection. Because the test identifies the DNA strands, the test is also referred to as the HPV DNA test. Although HPV is found in both males and females, the HPV test is only used to screen for cervical cancer in females. This test is recommended for females:  With an abnormal Pap test.  After treatment of an abnormal Pap test.  Aged 30 and older.  After treatment of a high-risk HPV infection. The HPV test may be done at the same time as a Pap test in females over the age of 30. Both the HPV and Pap test require a sample of cells from the cervix. PREPARATION FOR TEST  You may be asked to avoid douching, tampons, or vaginal medicines for 48 hours before the HPV test. You will be asked to urinate before the test. For the HPV test, you will need to lie on an exam table with your feet in stirrups. A spatula will be inserted into the vagina. The spatula will be used to swab the cervix for a cell and mucus sample. The sample will be further evaluated in a lab under a microscope. NORMAL FINDINGS  Normal: High-risk HPV is not found.  Ranges for normal findings may vary among different laboratories and hospitals. You should always check with your doctor after having lab work or other tests done to discuss the meaning of your test results and whether your values are considered within normal limits. MEANING  OF TEST An abnormal HPV test means that high-risk HPV is found. Your caregiver may recommend further testing. Your caregiver will go over the test results with you. He or she will and discuss the importance and meaning of your results, as well as treatment options and the need for additional tests, if necessary. OBTAINING THE RESULTS  It is your responsibility to obtain your test results. Ask the lab or department performing the test when and how you will get your results. Document Released: 11/14/2004 Document Revised: 01/12/2012 Document Reviewed: 07/30/2005 Exit[employer] Patient Information 2014 [employer].  

## 2014-01-11 ENCOUNTER — Other Ambulatory Visit: Payer: Self-pay | Admitting: Internal Medicine

## 2014-02-08 ENCOUNTER — Encounter (HOSPITAL_COMMUNITY): Payer: Self-pay | Admitting: Emergency Medicine

## 2014-02-08 ENCOUNTER — Emergency Department (INDEPENDENT_AMBULATORY_CARE_PROVIDER_SITE_OTHER)
Admission: EM | Admit: 2014-02-08 | Discharge: 2014-02-08 | Disposition: A | Discharge status: Home / Self Care | Payer: Self-pay | Source: Home / Self Care | Attending: Family Medicine | Admitting: Family Medicine

## 2014-02-08 ENCOUNTER — Other Ambulatory Visit: Payer: Self-pay | Admitting: Internal Medicine

## 2014-02-08 DIAGNOSIS — I00 Rheumatic fever without heart involvement: Secondary | ICD-10-CM

## 2014-02-08 DIAGNOSIS — M1380 Other specified arthritis, unspecified site: Secondary | ICD-10-CM

## 2014-02-08 MED ORDER — MELOXICAM 7.5 MG PO TABS: 7.5000 mg | ORAL_TABLET | Freq: Every day | ORAL | Status: DC

## 2014-02-08 NOTE — ED Notes (Signed)
Reportedly has been having pain x 4 months that travels from one joint to another . Could not be seen at [facility], so was advised to come here today

## 2014-02-08 NOTE — Discharge Instructions (Signed)
Use medicine as prescribed and see doctor as advised.

## 2014-02-08 NOTE — ED Provider Notes (Signed)
CSN: [id number]     Arrival date & time 02/08/14  1516 History   First MD Initiated Contact with Patient 02/08/14 1634     Chief Complaint  Patient presents with  . Back Pain   (Consider location/radiation/quality/duration/timing/severity/associated sxs/prior Treatment) Patient is a 63 y.o. female presenting with back pain. The history is provided by the patient.  Back Pain Location:  Lumbar spine Quality:  Stabbing and shooting Radiates to:  Does not radiate Pain severity:  Mild Pain is:  Worse during the night Onset quality:  Gradual Progression:  Waxing and waning Chronicity:  Chronic Relieved by:  Narcotics Associated symptoms: no abdominal pain, no bladder incontinence, no chest pain, no leg pain, no numbness, no paresthesias and no weakness     Past Medical History  Diagnosis Date  . Hypertension   . Thyroid disease   . Hypercholesterolemia   . Arthritis   . Back pain   . Ectopic pregnancy    Past Surgical History  Procedure Laterality Date  . Tubal ligation    . Ectopic pregnancy surgery    . Unilateral salpingectomy      R/T ectopic pregnancy   Family History  Problem Relation Age of Onset  . Diabetes Mother   . Hypertension Mother   . Diabetes Sister   . Hypertension Sister   . Hypertension Maternal Grandmother   . Colon cancer Neg Hx    History  Substance Use Topics  . Smoking status: Former Games developermoker  . Smokeless tobacco: Never Used  . Alcohol Use: No   OB History   Grav Para Term Preterm Abortions TAB SAB Ect Mult Living   8 6 6  0 2 0 1 1 0 6     Review of Systems  Constitutional: Negative.   Cardiovascular: Negative for chest pain.  Gastrointestinal: Negative for abdominal pain.  Genitourinary: Negative for bladder incontinence.  Musculoskeletal: Positive for arthralgias, back pain and neck pain. Negative for gait problem, joint swelling and neck stiffness.  Skin: Negative.   Neurological: Negative for weakness, numbness and paresthesias.     Allergies  Review of patient's allergies indicates no known allergies.  Home Medications   Current Outpatient Rx  Name  Route  Sig  Dispense  Refill  . ALPRAZolam (XANAX) 1 MG tablet   Oral   Take 1 tablet (1 mg total) by mouth 3 (three) times daily as needed for sleep.   30 tablet   0   . atorvastatin (LIPITOR) 10 MG tablet      TAKE 1 TABLET BY MOUTH AT BEDTIME.   30 tablet   2   . gabapentin (NEURONTIN) 100 MG capsule   Oral   Take 1 capsule (100 mg total) by mouth 3 (three) times daily.   90 capsule   3   . HYDROcodone-acetaminophen (NORCO) 10-325 MG per tablet   Oral   Take 1 tablet by mouth every 6 (six) hours as needed for pain.         . Iodine, Kelp, (KELP PO)   Oral   Take 1 tablet by mouth daily.         Marland Kitchen. levothyroxine (LEVOTHROID) 25 MCG tablet   Oral   Take 2.5 tablets (62.5 mcg total) by mouth daily before breakfast.   60 tablet   10   . lovastatin (MEVACOR) 10 MG tablet   Oral   Take 1 tablet (10 mg total) by mouth at bedtime.   30 tablet   2   .  meloxicam (MOBIC) 7.5 MG tablet   Oral   Take 1 tablet (7.5 mg total) by mouth daily.   30 tablet   1   . methocarbamol (ROBAXIN) 750 MG tablet      TAKE 1 TABLET BY MOUTH EVERY 6 HOURS AS NEEDED FOR MUSCLE SPASMS.   20 tablet   0   . promethazine (PHENERGAN) 25 MG tablet   Oral   Take 1 tablet (25 mg total) by mouth every 6 (six) hours as needed for nausea.   45 tablet   2   . triamterene-hydrochlorothiazide (MAXZIDE) 75-50 MG per tablet   Oral   Take 0.5 tablets by mouth daily.   30 tablet   3    BP 139/68  Pulse 68  Temp(Src) 98.7 F (37.1 C) (Oral)  Resp 18  SpO2 100% Physical Exam  Nursing note and vitals reviewed. Constitutional: She is oriented to person, place, and time. She appears well-developed and well-nourished.  Neck: Normal range of motion. Neck supple.  Abdominal: Soft. Bowel sounds are normal.  Lymphadenopathy:    She has no cervical adenopathy.   Neurological: She is alert and oriented to person, place, and time.  Skin: Skin is warm and dry.    ED Course  Procedures (including critical care time) Labs Review Labs Reviewed - No data to display Imaging Review No results found.   MDM   1. Migratory polyarthritis        Linna Hoff, MD 02/08/14 [phone]

## 2014-02-10 ENCOUNTER — Ambulatory Visit: Payer: Self-pay

## 2014-02-20 ENCOUNTER — Ambulatory Visit: Payer: Self-pay

## 2014-02-22 ENCOUNTER — Ambulatory Visit: Payer: Self-pay

## 2014-02-22 ENCOUNTER — Ambulatory Visit: Payer: Self-pay | Attending: Internal Medicine | Admitting: Internal Medicine

## 2014-02-22 ENCOUNTER — Encounter: Payer: Self-pay | Admitting: Internal Medicine

## 2014-02-22 VITALS — BP 120/71 | HR 79 | Temp 98.8°F | Resp 17

## 2014-02-22 DIAGNOSIS — F411 Generalized anxiety disorder: Secondary | ICD-10-CM | POA: Insufficient documentation

## 2014-02-22 DIAGNOSIS — E039 Hypothyroidism, unspecified: Secondary | ICD-10-CM | POA: Insufficient documentation

## 2014-02-22 DIAGNOSIS — E7800 Pure hypercholesterolemia, unspecified: Secondary | ICD-10-CM | POA: Insufficient documentation

## 2014-02-22 DIAGNOSIS — I10 Essential (primary) hypertension: Secondary | ICD-10-CM | POA: Insufficient documentation

## 2014-02-22 DIAGNOSIS — M542 Cervicalgia: Secondary | ICD-10-CM | POA: Insufficient documentation

## 2014-02-22 DIAGNOSIS — G8929 Other chronic pain: Secondary | ICD-10-CM | POA: Insufficient documentation

## 2014-02-22 DIAGNOSIS — E785 Hyperlipidemia, unspecified: Secondary | ICD-10-CM | POA: Insufficient documentation

## 2014-02-22 DIAGNOSIS — Z79899 Other long term (current) drug therapy: Secondary | ICD-10-CM | POA: Insufficient documentation

## 2014-02-22 NOTE — Progress Notes (Signed)
MRN: [id number] Name: Kimberly Blanchard  Sex: female Age: 63 y.o. DOB: [birth year]  Allergies: Review of patient's allergies indicates no known allergies.  Chief Complaint  Patient presents with  . Follow-up    HPI: Patient is 63 y.o. female who has history of hypothyroidism hypertension, anxiety, chronic back neck pain patient has been taking Neurontin and MOBIC, her blood pressure is stable denies any headache dizziness chest and shortness of breath, she has been taking her thyroid medication, denies any change in bowel habits nausea vomiting.  Past Medical History  Diagnosis Date  . Hypertension   . Thyroid disease   . Hypercholesterolemia   . Arthritis   . Back pain   . Ectopic pregnancy     Past Surgical History  Procedure Laterality Date  . Tubal ligation    . Ectopic pregnancy surgery    . Unilateral salpingectomy      R/T ectopic pregnancy      Medication List       This list is accurate as of: 02/22/14  5:06 PM.  Always use your most recent med list.               ALPRAZolam 1 MG tablet  Commonly known as:  XANAX  Take 1 tablet (1 mg total) by mouth 3 (three) times daily as needed for sleep.     atorvastatin 10 MG tablet  Commonly known as:  LIPITOR  TAKE 1 TABLET BY MOUTH AT BEDTIME.     gabapentin 100 MG capsule  Commonly known as:  NEURONTIN  Take 1 capsule (100 mg total) by mouth 3 (three) times daily.     HYDROcodone-acetaminophen 10-325 MG per tablet  Commonly known as:  NORCO  Take 1 tablet by mouth every 6 (six) hours as needed for pain.     KELP PO  Take 1 tablet by mouth daily.     levothyroxine 25 MCG tablet  Commonly known as:  LEVOTHROID  Take 2.5 tablets (62.5 mcg total) by mouth daily before breakfast.     meloxicam 7.5 MG tablet  Commonly known as:  MOBIC  Take 1 tablet (7.5 mg total) by mouth daily.     methocarbamol 750 MG tablet  Commonly known as:  ROBAXIN  TAKE 1 TABLET BY MOUTH EVERY 6 HOURS AS NEEDED FOR MUSCLE  SPASMS.     promethazine 25 MG tablet  Commonly known as:  PHENERGAN  Take 1 tablet (25 mg total) by mouth every 6 (six) hours as needed for nausea.     triamterene-hydrochlorothiazide 37.5-25 MG per tablet  Commonly known as:  MAXZIDE-25  TAKE 1 TABLET BY MOUTH DAILY.        No orders of the defined types were placed in this encounter.    Immunization History  Administered Date(s) Administered  . Influenza,inj,Quad PF,36+ Mos 12/05/2013  . Td 11/03/1996    Family History  Problem Relation Age of Onset  . Diabetes Mother   . Hypertension Mother   . Diabetes Sister   . Hypertension Sister   . Hypertension Maternal Grandmother   . Colon cancer Neg Hx     History  Substance Use Topics  . Smoking status: Former Games developermoker  . Smokeless tobacco: Never Used  . Alcohol Use: No    Review of Systems   As noted in HPI  Filed Vitals:   02/22/14 1639  BP: 120/71  Pulse: 79  Temp: 98.8 F (37.1 C)  Resp: 17    Physical Exam  Physical Exam  Constitutional: No distress.  Eyes: EOM are normal. Pupils are equal, round, and reactive to light.  Cardiovascular: Normal rate.   Pulmonary/Chest: Breath sounds normal. No respiratory distress. She has no wheezes. She has no rales.  Musculoskeletal:  Upper and lower back paraspinal tenderness    CBC    Component Value Date/Time   WBC 5.7 10/07/2013 1746   RBC 4.64 10/07/2013 1746   HGB 12.6 10/07/2013 1746   HCT 36.9 10/07/2013 1746   PLT 286 10/07/2013 1746   MCV 79.5 10/07/2013 1746   LYMPHSABS 1.9 10/07/2013 1746   MONOABS 0.4 10/07/2013 1746   EOSABS 0.3 10/07/2013 1746   BASOSABS 0.1 10/07/2013 1746    CMP     Component Value Date/Time   NA 140 10/07/2013 1746   K 4.0 10/07/2013 1746   CL 105 10/07/2013 1746   CO2 27 10/07/2013 1746   GLUCOSE 77 10/07/2013 1746   BUN 15 10/07/2013 1746   CREATININE 0.92 10/07/2013 1746   CALCIUM 9.5 10/07/2013 1746   PROT 7.2 10/07/2013 1746   ALBUMIN 4.1 10/07/2013 1746   AST 19  10/07/2013 1746   ALT 14 10/07/2013 1746   ALKPHOS 67 10/07/2013 1746   BILITOT 0.3 10/07/2013 1746   GFRNONAA 67 10/07/2013 1746   GFRAA 77 10/07/2013 1746    Lab Results  Component Value Date/Time   CHOL 195 10/07/2013  5:46 PM    No components found with this basename: hga1c    Lab Results  Component Value Date/Time   AST 19 10/07/2013  5:46 PM    Assessment and Plan  Unspecified hypothyroidism - Plan: Patient is taking levothyroxine 62.5 mcg daily we'll repeat her TSH, T4, free level.  Essential hypertension, benign - Plan: well controlled continue with Maxzide, will repeat blood chemistry COMPLETE METABOLIC PANEL WITH GFR  Other and unspecified hyperlipidemia Patient is on Lipitor 10 mg, will repeat fasting lipid panel on the next visit.  Health Maintenance -Colonoscopy: Up-to-date with a colonoscopy  -Mammogram: Up-to-date with mammogram  Return in about 3 months (around 05/24/2014) for hypertension, hypothyroid.  Doris Cheadleeepak Teon Hudnall, MD

## 2014-02-22 NOTE — Progress Notes (Signed)
Patient here for follow up Had mammogram, colonoscopy and blood work Here for those results as well

## 2014-02-23 ENCOUNTER — Other Ambulatory Visit: Payer: Self-pay

## 2014-02-23 ENCOUNTER — Telehealth: Payer: Self-pay

## 2014-02-23 LAB — COMPLETE METABOLIC PANEL WITH GFR
ALT: 11 U/L (ref 0–35)
AST: 15 U/L (ref 0–37)
Albumin: 3.9 g/dL (ref 3.5–5.2)
Alkaline Phosphatase: 76 U/L (ref 39–117)
BILIRUBIN TOTAL: 0.3 mg/dL (ref 0.2–1.2)
BUN: 18 mg/dL (ref 6–23)
CO2: 28 mEq/L (ref 19–32)
Calcium: 9.6 mg/dL (ref 8.4–10.5)
Chloride: 108 mEq/L (ref 96–112)
Creat: 0.9 mg/dL (ref 0.50–1.10)
GFR, EST AFRICAN AMERICAN: 79 mL/min
GFR, Est Non African American: 68 mL/min
GLUCOSE: 86 mg/dL (ref 70–99)
Potassium: 3.9 mEq/L (ref 3.5–5.3)
SODIUM: 144 meq/L (ref 135–145)
TOTAL PROTEIN: 6.6 g/dL (ref 6.0–8.3)

## 2014-02-23 LAB — TSH: TSH: 1.598 u[IU]/mL (ref 0.350–4.500)

## 2014-02-23 LAB — T4, FREE: Free T4: 1.26 ng/dL (ref 0.80–1.80)

## 2014-02-23 MED ORDER — LEVOTHYROXINE SODIUM 25 MCG PO TABS: 62.5000 ug | ORAL_TABLET | Freq: Every day | ORAL | Status: DC

## 2014-02-23 NOTE — Telephone Encounter (Signed)
Message copied by Lestine MountJUAREZ, Bryan Omura L on Thu Feb 23, 2014 10:38 AM ------      Message from: Doris CheadleADVANI, DEEPAK      Created: Thu Feb 23, 2014  9:17 AM       Blood work reviewed call and let the patient know that her thyroid test is within normal range, continue with current dose of levothyroxine, will repeat TSH level on next visit. ------

## 2014-02-23 NOTE — Telephone Encounter (Signed)
Patient is aware of her lab results 

## 2014-04-11 ENCOUNTER — Other Ambulatory Visit: Payer: Self-pay | Admitting: Internal Medicine

## 2014-04-14 ENCOUNTER — Other Ambulatory Visit: Payer: Self-pay

## 2014-04-14 MED ORDER — MELOXICAM 7.5 MG PO TABS: 7.5000 mg | ORAL_TABLET | Freq: Every day | ORAL | Status: DC

## 2014-04-19 ENCOUNTER — Other Ambulatory Visit: Payer: Self-pay | Admitting: Internal Medicine

## 2014-04-25 ENCOUNTER — Other Ambulatory Visit: Payer: Self-pay | Admitting: Internal Medicine

## 2014-05-24 ENCOUNTER — Ambulatory Visit: Payer: Self-pay | Admitting: Internal Medicine

## 2014-06-27 ENCOUNTER — Other Ambulatory Visit: Payer: Self-pay | Admitting: Internal Medicine

## 2014-06-27 DIAGNOSIS — I10 Essential (primary) hypertension: Secondary | ICD-10-CM

## 2014-07-25 ENCOUNTER — Telehealth: Payer: Self-pay | Admitting: Internal Medicine

## 2014-07-25 ENCOUNTER — Telehealth: Payer: Self-pay | Admitting: Emergency Medicine

## 2014-07-25 NOTE — Telephone Encounter (Signed)
Pt would like to speak to a nurse regarding how to care for a brush burn (a scrape).  Please f/u with pt.

## 2014-07-25 NOTE — Telephone Encounter (Signed)
Question addressed. Pt states she fell and scraped knee last Sunday. States scabbing noted  Instructed pt to clean with peroxide, allow to dry then cover with Neosporin' Encouraged pt to schedule f/u OV if increased pain

## 2014-08-02 ENCOUNTER — Telehealth: Payer: Self-pay | Admitting: Emergency Medicine

## 2014-08-02 ENCOUNTER — Telehealth: Payer: Self-pay | Admitting: Internal Medicine

## 2014-08-02 NOTE — Telephone Encounter (Signed)
Pt. Came into facility wanting to see the triage nurse for right knee pain, pt. Larey SeatFell a week ago, and is still having pain.Please f/u with pt.

## 2014-08-02 NOTE — Telephone Encounter (Signed)
Left message for letting pt know she will need to schedule OV for knee pain

## 2014-08-03 ENCOUNTER — Telehealth: Payer: Self-pay | Admitting: Emergency Medicine

## 2014-08-03 NOTE — Telephone Encounter (Signed)
Pt was given ov appointment 10/7 for knee pain with wound

## 2014-08-03 NOTE — Telephone Encounter (Signed)
Left message addressing knee pain with open wound. Instructed pt to go to Urgent care if sx's worsens with pain,swelling and pus drainage. Pt has scheduled ov 08/09/2014

## 2014-08-03 NOTE — Telephone Encounter (Signed)
Pt is calling again for the nurses advise to see what they might recommend she do for the pain in her right knee. She is unaware if she should keep it with a wet compress or dry compress. When she puts the cream on her open wound on her right knee it look pusy and if not it looks dry and crusty. She is concerned it might be infected and the pain medicine is not working well. Please follow up with pt.

## 2014-08-09 ENCOUNTER — Encounter: Payer: Self-pay | Admitting: Internal Medicine

## 2014-08-09 ENCOUNTER — Ambulatory Visit: Payer: Self-pay | Attending: Internal Medicine | Admitting: Internal Medicine

## 2014-08-09 VITALS — BP 125/78 | HR 84 | Temp 98.3°F | Resp 16 | Wt 168.4 lb

## 2014-08-09 DIAGNOSIS — I10 Essential (primary) hypertension: Secondary | ICD-10-CM | POA: Insufficient documentation

## 2014-08-09 DIAGNOSIS — E038 Other specified hypothyroidism: Secondary | ICD-10-CM

## 2014-08-09 DIAGNOSIS — Z23 Encounter for immunization: Secondary | ICD-10-CM

## 2014-08-09 DIAGNOSIS — L0390 Cellulitis, unspecified: Secondary | ICD-10-CM

## 2014-08-09 DIAGNOSIS — E039 Hypothyroidism, unspecified: Secondary | ICD-10-CM | POA: Insufficient documentation

## 2014-08-09 DIAGNOSIS — S80211A Abrasion, right knee, initial encounter: Secondary | ICD-10-CM | POA: Insufficient documentation

## 2014-08-09 DIAGNOSIS — W19XXXA Unspecified fall, initial encounter: Secondary | ICD-10-CM | POA: Insufficient documentation

## 2014-08-09 DIAGNOSIS — T148 Other injury of unspecified body region: Secondary | ICD-10-CM

## 2014-08-09 DIAGNOSIS — T148XXA Other injury of unspecified body region, initial encounter: Secondary | ICD-10-CM

## 2014-08-09 DIAGNOSIS — S80212A Abrasion, left knee, initial encounter: Secondary | ICD-10-CM | POA: Insufficient documentation

## 2014-08-09 MED ORDER — CEPHALEXIN 500 MG PO CAPS: 500.0000 mg | ORAL_CAPSULE | Freq: Three times a day (TID) | ORAL | Status: DC

## 2014-08-09 MED ORDER — FLUCONAZOLE 150 MG PO TABS: 150.0000 mg | ORAL_TABLET | Freq: Once | ORAL | Status: DC

## 2014-08-09 NOTE — Patient Instructions (Signed)

## 2014-08-09 NOTE — Progress Notes (Signed)
MRN: [id number] Name: Kimberly Blanchard  Sex: female Age: 63 y.o. DOB: 06/10/1951  Allergies: Review of patient's allergies indicates no known allergies.  Chief Complaint  Patient presents with  . knee wound    HPI: Patient is 63 y.o. female who comes today reported to have fallen 2 weeks ago she fell on her both knees and suffered an abrasion on both knees R>L, the left side was healing well but on the right knee she has an open wound, patient has been applying dressing as well as antibiotic ointment she reports improvement but still has some discharge denies any fever chills patient has been taking her pain medication. Patient also history of hypertension and hypothyroidism as per patient she is compliant in taking her medications her blood pressure is well controlled vitals are stable patient is afebrile. Past Medical History  Diagnosis Date  . Hypertension   . Thyroid disease   . Hypercholesterolemia   . Arthritis   . Back pain   . Ectopic pregnancy     Past Surgical History  Procedure Laterality Date  . Tubal ligation    . Ectopic pregnancy surgery    . Unilateral salpingectomy      R/T ectopic pregnancy      Medication List       This list is accurate as of: 08/09/14  3:01 PM.  Always use your most recent med list.               ALPRAZolam 1 MG tablet  Commonly known as:  XANAX  Take 1 tablet (1 mg total) by mouth 3 (three) times daily as needed for sleep.     atorvastatin 10 MG tablet  Commonly known as:  LIPITOR  TAKE 1 TABLET BY MOUTH AT BEDTIME.     cephALEXin 500 MG capsule  Commonly known as:  KEFLEX  Take 1 capsule (500 mg total) by mouth 3 (three) times daily.     fluconazole 150 MG tablet  Commonly known as:  DIFLUCAN  Take 1 tablet (150 mg total) by mouth once.     gabapentin 100 MG capsule  Commonly known as:  NEURONTIN  TAKE 1 CAPSULE BY MOUTH 3 TIMES DAILY.     HYDROcodone-acetaminophen 10-325 MG per tablet  Commonly known as:  NORCO    Take 1 tablet by mouth every 6 (six) hours as needed for pain.     KELP PO  Take 1 tablet by mouth daily.     levothyroxine 25 MCG tablet  Commonly known as:  LEVOTHROID  Take 2.5 tablets (62.5 mcg total) by mouth daily before breakfast.     levothyroxine 25 MCG tablet  Commonly known as:  SYNTHROID, LEVOTHROID  Take 2.5 tablets (62.5 mcg total) by mouth daily before breakfast.     meloxicam 7.5 MG tablet  Commonly known as:  MOBIC  Take 1 tablet (7.5 mg total) by mouth daily.     methocarbamol 750 MG tablet  Commonly known as:  ROBAXIN  TAKE 1 TABLET BY MOUTH EVERY 6 HOURS AS NEEDED FOR MUSCLE SPASMS.     promethazine 25 MG tablet  Commonly known as:  PHENERGAN  TAKE 1 TABLET BY MOUTH EVERY 6 HOURS AS NEEDED FOR NAUSEA.     triamterene-hydrochlorothiazide 37.5-25 MG per tablet  Commonly known as:  MAXZIDE-25  TAKE 1 TABLET BY MOUTH DAILY.        Meds ordered this encounter  Medications  . fluconazole (DIFLUCAN) 150 MG tablet  Sig: Take 1 tablet (150 mg total) by mouth once.    Dispense:  1 tablet    Refill:  0  . cephALEXin (KEFLEX) 500 MG capsule    Sig: Take 1 capsule (500 mg total) by mouth 3 (three) times daily.    Dispense:  30 capsule    Refill:  0    Immunization History  Administered Date(s) Administered  . Influenza,inj,Quad PF,36+ Mos 12/05/2013  . Td 11/03/1996    Family History  Problem Relation Age of Onset  . Diabetes Mother   . Hypertension Mother   . Diabetes Sister   . Hypertension Sister   . Hypertension Maternal Grandmother   . Colon cancer Neg Hx     History  Substance Use Topics  . Smoking status: Former Games developermoker  . Smokeless tobacco: Never Used  . Alcohol Use: No    Review of Systems   As noted in HPI  Filed Vitals:   08/09/14 1429  BP: 125/78  Pulse: 84  Temp: 98.3 F (36.8 C)  Resp: 16    Physical Exam  Physical Exam  Constitutional: No distress.  Eyes: EOM are normal. Pupils are equal, round, and reactive  to light.  Cardiovascular: Normal rate and regular rhythm.   Pulmonary/Chest: Breath sounds normal. No respiratory distress. She has no wheezes. She has no rales.  Musculoskeletal:  Bilateral knee skin abrasion, right knee abrasion+ open wound surrounding erythema and tenderness, minimal discharge in the center surrounding scab formation    CBC    Component Value Date/Time   WBC 5.7 10/07/2013 1746   RBC 4.64 10/07/2013 1746   HGB 12.6 10/07/2013 1746   HCT 36.9 10/07/2013 1746   PLT 286 10/07/2013 1746   MCV 79.5 10/07/2013 1746   LYMPHSABS 1.9 10/07/2013 1746   MONOABS 0.4 10/07/2013 1746   EOSABS 0.3 10/07/2013 1746   BASOSABS 0.1 10/07/2013 1746    CMP     Component Value Date/Time   NA 144 02/22/2014 1707   K 3.9 02/22/2014 1707   CL 108 02/22/2014 1707   CO2 28 02/22/2014 1707   GLUCOSE 86 02/22/2014 1707   BUN 18 02/22/2014 1707   CREATININE 0.90 02/22/2014 1707   CALCIUM 9.6 02/22/2014 1707   PROT 6.6 02/22/2014 1707   ALBUMIN 3.9 02/22/2014 1707   AST 15 02/22/2014 1707   ALT 11 02/22/2014 1707   ALKPHOS 76 02/22/2014 1707   BILITOT 0.3 02/22/2014 1707   GFRNONAA 68 02/22/2014 1707   GFRAA 79 02/22/2014 1707    Lab Results  Component Value Date/Time   CHOL 195 10/07/2013  5:46 PM    No components found with this basename: hga1c    Lab Results  Component Value Date/Time   AST 15 02/22/2014  5:07 PM    Assessment and Plan  Skin abrasion/Cellulitis, unspecified cellulitis site, unspecified extremity site, unspecified laterality - Plan: Patient is given information regarding home care for the wound, prescribed cephALEXin (KEFLEX) 500 MG capsule to take for 10 days, she's also given tetanus shot today, she'll come back in 2 weeks for wound check with nurse. Patient was given clear instructions to get medical attention if she has worsening of the symptoms her develops any worsening redness around the wound of worsening discharge, she understands and verbalized the   Instructions.  Essential hypertension, benign - Plan: Pressure is well controlled continue with current meds, check blood chemistry COMPLETE METABOLIC PANEL WITH GFR  Other specified hypothyroidism - Plan: Continue with current dose,  repeat her TSH level  Need for Tdap vaccination - Plan: Tdap vaccine greater than or equal to 7yo IM   Return in about 3 months (around 11/09/2014) for hypertension, hypothyroid, wound check in 2 weeks/Nurse Visit.  Doris Cheadle, MD

## 2014-08-09 NOTE — Progress Notes (Signed)
Patient states she fell about two weeks ago and  Injured both knees. Patient is concerned the abrasion to her right Knee is not healing

## 2014-08-10 LAB — COMPLETE METABOLIC PANEL WITH GFR
ALT: 14 U/L (ref 0–35)
AST: 19 U/L (ref 0–37)
Albumin: 4.3 g/dL (ref 3.5–5.2)
Alkaline Phosphatase: 78 U/L (ref 39–117)
BUN: 18 mg/dL (ref 6–23)
CO2: 28 mEq/L (ref 19–32)
Calcium: 9.6 mg/dL (ref 8.4–10.5)
Chloride: 102 mEq/L (ref 96–112)
Creat: 1.03 mg/dL (ref 0.50–1.10)
GFR, Est African American: 67 mL/min
GFR, Est Non African American: 58 mL/min — ABNORMAL LOW
Glucose, Bld: 122 mg/dL — ABNORMAL HIGH (ref 70–99)
POTASSIUM: 3.9 meq/L (ref 3.5–5.3)
Sodium: 140 mEq/L (ref 135–145)
TOTAL PROTEIN: 7.1 g/dL (ref 6.0–8.3)
Total Bilirubin: 0.3 mg/dL (ref 0.2–1.2)

## 2014-08-10 LAB — TSH: TSH: 8.728 u[IU]/mL — ABNORMAL HIGH (ref 0.350–4.500)

## 2014-08-11 ENCOUNTER — Other Ambulatory Visit: Payer: Self-pay | Admitting: Internal Medicine

## 2014-08-18 ENCOUNTER — Telehealth: Payer: Self-pay | Admitting: *Deleted

## 2014-08-18 NOTE — Telephone Encounter (Signed)
Message copied by Dyann KiefGIRALDEZ, Gates Jividen M on Fri Aug 18, 2014 10:09 AM ------      Message from: Doris CheadleADVANI, DEEPAK      Created: Thu Aug 10, 2014  1:01 PM       Blood work reviewed noticed impaired fasting glucose, call and advise patient for low carbohydrate diet.      Also noticed her TSH level is abnormal, advise patient to increase the dose of levothyroxine to 75 mcg daily. Will repeat her level on the next visit. ------

## 2014-08-23 ENCOUNTER — Ambulatory Visit: Payer: Self-pay | Attending: Internal Medicine

## 2014-08-23 ENCOUNTER — Ambulatory Visit: Payer: Self-pay

## 2014-08-23 DIAGNOSIS — E785 Hyperlipidemia, unspecified: Secondary | ICD-10-CM

## 2014-08-23 MED ORDER — LEVOTHYROXINE SODIUM 75 MCG PO TABS: 75.0000 ug | ORAL_TABLET | Freq: Every day | ORAL | Status: DC

## 2014-08-23 NOTE — Patient Instructions (Signed)
Continue to apply bacitracin ointment and apply dry dressing  Keep covered until wound completely scabbed then keep open We will call you with lab results

## 2014-08-23 NOTE — Progress Notes (Unsigned)
Pt here for wound check to right knee s/p fall 08/09/14 Wound intact with scabbed area in center, slight pus drainage noted with swelling Pt denies pain Pt is using Bacitracin to affected area with changing dressing Cleansed wound with betadine/and applied clean bandage Pt given lab results to increase Levothyroxine to 75 mcg Lipid panel ordered

## 2014-09-08 ENCOUNTER — Telehealth: Payer: Self-pay | Admitting: Internal Medicine

## 2014-09-08 ENCOUNTER — Ambulatory Visit: Payer: Self-pay | Attending: Internal Medicine

## 2014-09-08 DIAGNOSIS — Z0000 Encounter for general adult medical examination without abnormal findings: Secondary | ICD-10-CM

## 2014-09-08 LAB — LIPID PANEL
Cholesterol: 141 mg/dL (ref 0–200)
HDL: 52 mg/dL (ref >39)
LDL Cholesterol: 68 mg/dL (ref 0–99)
Total CHOL/HDL Ratio: 2.7 Ratio
Triglycerides: 106 mg/dL (ref <150)
VLDL: 21 mg/dL (ref 0–40)

## 2014-09-08 NOTE — Telephone Encounter (Signed)
Pt is requesting her results from her HIV and

## 2014-10-06 ENCOUNTER — Ambulatory Visit: Payer: Self-pay | Attending: Internal Medicine | Admitting: Internal Medicine

## 2014-10-06 VITALS — BP 108/64 | HR 83 | Temp 99.1°F | Resp 22

## 2014-10-06 DIAGNOSIS — R059 Cough, unspecified: Secondary | ICD-10-CM

## 2014-10-06 DIAGNOSIS — R05 Cough: Secondary | ICD-10-CM | POA: Insufficient documentation

## 2014-10-06 NOTE — Progress Notes (Signed)
Patient walked in c/o dry, non-productive cough for 3 days States last evening had 1 time cough productive of thick, dark green sputum and once again this am States now with light yellow sputum Patient denies fever, chills, aches (except chronic right shoulder ache from torn rotator cuff)  Denies sore throat, ear pain, nasal congestion, nasal drainage  Patient states she is a former smoker ( 5 cigs/day X 30 years; quit 2 years ago)  BP 108/64 P 83 T 99.1 oral R 22 SPO2 97%  No tenderness noted upon palpation of maxillary or frontal sinuses No lymphadenopathy noted Ears: clear without redness or drainage Throat: clear without redness or drainage Lungs clear to auscultation with good breath sounds noted  Patient instructed that this is most likely viral at this time Encouraged to rest, drink plenty of fluids (water), may take OTC mucinex prn Call back if sx worsen or fail to improve   Agree with RN's assessment and intervention  Kimberly Cheadleeepak Advani, MD

## 2014-11-01 ENCOUNTER — Ambulatory Visit: Payer: Self-pay | Attending: Internal Medicine

## 2014-11-13 ENCOUNTER — Other Ambulatory Visit: Payer: Self-pay | Admitting: Internal Medicine

## 2014-11-14 ENCOUNTER — Telehealth: Payer: Self-pay | Admitting: Internal Medicine

## 2014-11-14 ENCOUNTER — Other Ambulatory Visit: Payer: Self-pay | Admitting: *Deleted

## 2014-11-14 DIAGNOSIS — I10 Essential (primary) hypertension: Secondary | ICD-10-CM

## 2014-11-14 MED ORDER — TRIAMTERENE-HCTZ 37.5-25 MG PO TABS: 1.0000 | ORAL_TABLET | Freq: Every day | ORAL | Status: DC

## 2014-11-14 NOTE — Telephone Encounter (Signed)
Patient came into facility to request med refill for triamterene-hydrochlorothiazide (MAXZIDE-25) 37.5-25 MG per tablet. Please f/u with pt.

## 2014-12-11 ENCOUNTER — Ambulatory Visit: Payer: Self-pay | Attending: Internal Medicine | Admitting: Internal Medicine

## 2014-12-11 ENCOUNTER — Encounter: Payer: Self-pay | Admitting: Internal Medicine

## 2014-12-11 ENCOUNTER — Ambulatory Visit (HOSPITAL_COMMUNITY)
Admission: RE | Admit: 2014-12-11 | Discharge: 2014-12-11 | Disposition: A | Discharge status: Home / Self Care | Payer: No Typology Code available for payment source | Source: Ambulatory Visit | Attending: Internal Medicine | Admitting: Internal Medicine

## 2014-12-11 VITALS — BP 113/71 | HR 80 | Temp 98.0°F | Resp 16 | Wt 172.2 lb

## 2014-12-11 DIAGNOSIS — M25551 Pain in right hip: Secondary | ICD-10-CM

## 2014-12-11 DIAGNOSIS — E039 Hypothyroidism, unspecified: Secondary | ICD-10-CM

## 2014-12-11 DIAGNOSIS — E785 Hyperlipidemia, unspecified: Secondary | ICD-10-CM

## 2014-12-11 DIAGNOSIS — I10 Essential (primary) hypertension: Secondary | ICD-10-CM

## 2014-12-11 DIAGNOSIS — Z87891 Personal history of nicotine dependence: Secondary | ICD-10-CM | POA: Insufficient documentation

## 2014-12-11 DIAGNOSIS — Z90721 Acquired absence of ovaries, unilateral: Secondary | ICD-10-CM | POA: Insufficient documentation

## 2014-12-11 DIAGNOSIS — M778 Other enthesopathies, not elsewhere classified: Secondary | ICD-10-CM | POA: Insufficient documentation

## 2014-12-11 LAB — LIPID PANEL
Cholesterol: 145 mg/dL (ref 0–200)
HDL: 61 mg/dL (ref >39)
LDL CALC: 74 mg/dL (ref 0–99)
TRIGLYCERIDES: 52 mg/dL (ref <150)
Total CHOL/HDL Ratio: 2.4 Ratio
VLDL: 10 mg/dL (ref 0–40)

## 2014-12-11 LAB — COMPLETE METABOLIC PANEL WITH GFR
ALBUMIN: 3.8 g/dL (ref 3.5–5.2)
ALT: 17 U/L (ref 0–35)
AST: 21 U/L (ref 0–37)
Alkaline Phosphatase: 79 U/L (ref 39–117)
BILIRUBIN TOTAL: 0.4 mg/dL (ref 0.2–1.2)
BUN: 18 mg/dL (ref 6–23)
CO2: 26 meq/L (ref 19–32)
Calcium: 9.5 mg/dL (ref 8.4–10.5)
Chloride: 107 mEq/L (ref 96–112)
Creat: 0.94 mg/dL (ref 0.50–1.10)
GFR, EST AFRICAN AMERICAN: 74 mL/min
GFR, EST NON AFRICAN AMERICAN: 64 mL/min
Glucose, Bld: 80 mg/dL (ref 70–99)
POTASSIUM: 4.5 meq/L (ref 3.5–5.3)
SODIUM: 141 meq/L (ref 135–145)
TOTAL PROTEIN: 6.7 g/dL (ref 6.0–8.3)

## 2014-12-11 MED ORDER — TRIAMTERENE-HCTZ 37.5-25 MG PO TABS: 1.0000 | ORAL_TABLET | Freq: Every day | ORAL | Status: DC

## 2014-12-11 NOTE — Progress Notes (Signed)
MRN: [id number] Name: Kimberly BectonSalinda Blanchard  Sex: female Age: 64 y.o. DOB: 09/16/1951  Allergies: Mobic  No chief complaint on file.   HPI: Patient is 64 y.o. female who has to of hypertension, hypothyroidism, hyperlipidemia comes today for followup on the last visit her thyroid medication dose was increased, her blood pressure is well controlled, she's requesting refill on her medication, she also reported to have right hip pain on and off for several weeks,, denies any recent fall or trauma, the pain is more worse at night, denies any fever chills..  Past Medical History  Diagnosis Date  . Hypertension   . Thyroid disease   . Hypercholesterolemia   . Arthritis   . Back pain   . Ectopic pregnancy     Past Surgical History  Procedure Laterality Date  . Tubal ligation    . Ectopic pregnancy surgery    . Unilateral salpingectomy      R/T ectopic pregnancy      Medication List       This list is accurate as of: 12/11/14  1:07 PM.  Always use your most recent med list.               ALPRAZolam 1 MG tablet  Commonly known as:  XANAX  Take 1 tablet (1 mg total) by mouth 3 (three) times daily as needed for sleep.     atorvastatin 10 MG tablet  Commonly known as:  LIPITOR  TAKE 1 TABLET BY MOUTH AT BEDTIME.     cephALEXin 500 MG capsule  Commonly known as:  KEFLEX  Take 1 capsule (500 mg total) by mouth 3 (three) times daily.     fluconazole 150 MG tablet  Commonly known as:  DIFLUCAN  Take 1 tablet (150 mg total) by mouth once.     gabapentin 100 MG capsule  Commonly known as:  NEURONTIN  TAKE 1 CAPSULE BY MOUTH 3 TIMES DAILY.     HYDROcodone-acetaminophen 10-325 MG per tablet  Commonly known as:  NORCO  Take 1 tablet by mouth every 6 (six) hours as needed for pain.     KELP PO  Take 1 tablet by mouth daily.     levothyroxine 25 MCG tablet  Commonly known as:  SYNTHROID, LEVOTHROID  Take 2.5 tablets (62.5 mcg total) by mouth daily before breakfast.     levothyroxine 75 MCG tablet  Commonly known as:  LEVOTHROID  Take 1 tablet (75 mcg total) by mouth daily before breakfast.     methocarbamol 750 MG tablet  Commonly known as:  ROBAXIN  TAKE 1 TABLET BY MOUTH EVERY 6 HOURS AS NEEDED FOR MUSCLE SPASMS.     promethazine 25 MG tablet  Commonly known as:  PHENERGAN  TAKE 1 TABLET BY MOUTH EVERY 6 HOURS AS NEEDED FOR NAUSEA.     triamterene-hydrochlorothiazide 37.5-25 MG per tablet  Commonly known as:  MAXZIDE-25  Take 1 tablet by mouth daily.        Meds ordered this encounter  Medications  . triamterene-hydrochlorothiazide (MAXZIDE-25) 37.5-25 MG per tablet    Sig: Take 1 tablet by mouth daily.    Dispense:  30 tablet    Refill:  3    Immunization History  Administered Date(s) Administered  . Influenza,inj,Quad PF,36+ Mos 12/05/2013, 08/09/2014  . Td 11/03/1996  . Tdap 08/09/2014    Family History  Problem Relation Age of Onset  . Diabetes Mother   . Hypertension Mother   . Diabetes Sister   .  Hypertension Sister   . Hypertension Maternal Grandmother   . Colon cancer Neg Hx     History  Substance Use Topics  . Smoking status: Former Games developer  . Smokeless tobacco: Never Used  . Alcohol Use: No    Review of Systems   As noted in HPI  Filed Vitals:   12/11/14 1231  BP: 113/71  Pulse: 80  Temp: 98 F (36.7 C)  Resp: 16    Physical Exam  Physical Exam  Constitutional: No distress.  Eyes: EOM are normal. Pupils are equal, round, and reactive to light.  Cardiovascular: Normal rate and regular rhythm.   Pulmonary/Chest: Breath sounds normal. No respiratory distress. She has no wheezes. She has no rales.  Musculoskeletal: She exhibits no edema.  Lower lumbar no spinal or paraspinal tenderness, SLR test negative,right hip  good range of motion.    CBC    Component Value Date/Time   WBC 5.7 10/07/2013 1746   RBC 4.64 10/07/2013 1746   HGB 12.6 10/07/2013 1746   HCT 36.9 10/07/2013 1746   PLT 286  10/07/2013 1746   MCV 79.5 10/07/2013 1746   LYMPHSABS 1.9 10/07/2013 1746   MONOABS 0.4 10/07/2013 1746   EOSABS 0.3 10/07/2013 1746   BASOSABS 0.1 10/07/2013 1746    CMP     Component Value Date/Time   NA 140 08/09/2014 1459   K 3.9 08/09/2014 1459   CL 102 08/09/2014 1459   CO2 28 08/09/2014 1459   GLUCOSE 122* 08/09/2014 1459   BUN 18 08/09/2014 1459   CREATININE 1.03 08/09/2014 1459   CALCIUM 9.6 08/09/2014 1459   PROT 7.1 08/09/2014 1459   ALBUMIN 4.3 08/09/2014 1459   AST 19 08/09/2014 1459   ALT 14 08/09/2014 1459   ALKPHOS 78 08/09/2014 1459   BILITOT 0.3 08/09/2014 1459   GFRNONAA 58* 08/09/2014 1459   GFRAA 67 08/09/2014 1459    Lab Results  Component Value Date/Time   CHOL 141 09/08/2014 11:32 AM    No components found for: HGA1C  Lab Results  Component Value Date/Time   AST 19 08/09/2014 02:59 PM    Assessment and Plan  Essential hypertension, benign - Plan:blood pressure is well controlled, continue current meds  triamterene-hydrochlorothiazide (MAXZIDE-25) 37.5-25 MG per tablet, COMPLETE METABOLIC PANEL WITH GFR  Hypothyroidism, unspecified hypothyroidism type - Plan: currently patient is on levothyroxine 75 mcg daily, will repeat TSH  Right hip pain - Plan: DG HIP UNILAT WITH PELVIS 2-3 VIEWS RIGHT, Vit D  25 hydroxy (rtn osteoporosis monitoring)  Hyperlipidemia - Plan:currently patient is on Lipitor, recheck  Lipid panel   Health Maintenance  -Mammogram:patient is due and will schedule. -Vaccinations:  Patient is up-to-date with flu shot.  Return in about 3 months (around 03/11/2015) for hypertension.  Doris Cheadle, MD

## 2014-12-11 NOTE — Progress Notes (Signed)
Patient here for follow up Complains of having pain to her right buttocks and hip Needs medication refilled as well

## 2014-12-12 ENCOUNTER — Telehealth: Payer: Self-pay

## 2014-12-12 LAB — VITAMIN D 25 HYDROXY (VIT D DEFICIENCY, FRACTURES): Vit D, 25-Hydroxy: 18 ng/mL — ABNORMAL LOW (ref 30–100)

## 2014-12-12 LAB — TSH: TSH: 3.162 u[IU]/mL (ref 0.350–4.500)

## 2014-12-12 MED ORDER — VITAMIN D (ERGOCALCIFEROL) 1.25 MG (50000 UNIT) PO CAPS: 50000.0000 [IU] | ORAL_CAPSULE | ORAL | Status: DC

## 2014-12-12 NOTE — Telephone Encounter (Signed)
Patient not available via telephone Message left on voice mail to return our call

## 2014-12-12 NOTE — Telephone Encounter (Signed)
-----   Message from Doris Cheadleeepak Advani, MD sent at 12/12/2014  9:10 AM EST ----- Blood work reviewed, noticed low vitamin D, call patient advise to start ergocalciferol 50,000 units once a week for the duration of  12 weeks. Also her TSH level is now in the normal range, continue with current dose of levothyroxine 75 mcg daily.

## 2014-12-13 ENCOUNTER — Telehealth: Payer: Self-pay | Admitting: Internal Medicine

## 2014-12-13 NOTE — Telephone Encounter (Signed)
Pt came into clinic requesting blood work results. Please f/u with patient.

## 2014-12-13 NOTE — Telephone Encounter (Signed)
Pt aware of lab results Will Pick-up refills tomorrow

## 2014-12-19 ENCOUNTER — Telehealth: Payer: Self-pay | Admitting: Internal Medicine

## 2014-12-19 NOTE — Telephone Encounter (Signed)
Pt had x-rays done last week and came in to inquire about her results. Please follow up with pt.

## 2014-12-27 ENCOUNTER — Telehealth: Payer: Self-pay | Admitting: *Deleted

## 2014-12-27 DIAGNOSIS — M160 Bilateral primary osteoarthritis of hip: Secondary | ICD-10-CM

## 2014-12-27 NOTE — Telephone Encounter (Signed)
Ortho referral send .

## 2014-12-27 NOTE — Telephone Encounter (Signed)
Patient presents to clinic to request results from x ray. Please assist

## 2015-01-03 ENCOUNTER — Other Ambulatory Visit: Payer: Self-pay | Admitting: Internal Medicine

## 2015-01-10 ENCOUNTER — Other Ambulatory Visit: Payer: Self-pay | Admitting: Internal Medicine

## 2015-01-12 ENCOUNTER — Telehealth: Payer: Self-pay | Admitting: Internal Medicine

## 2015-01-12 NOTE — Telephone Encounter (Signed)
Patient has come in today to refill medications and has asked for nurse to call in medication for levothyroxine (LEVOTHROID) 75 MCG tablet; please f/u with patient as she says she usually receives this medication at no charge

## 2015-01-16 ENCOUNTER — Ambulatory Visit: Payer: No Typology Code available for payment source | Attending: Internal Medicine

## 2015-01-23 ENCOUNTER — Other Ambulatory Visit: Payer: Self-pay | Admitting: Internal Medicine

## 2015-01-23 ENCOUNTER — Telehealth: Payer: Self-pay

## 2015-01-23 MED ORDER — LEVOTHYROXINE SODIUM 25 MCG PO TABS: 62.5000 ug | ORAL_TABLET | Freq: Every day | ORAL | Status: DC

## 2015-01-23 NOTE — Telephone Encounter (Signed)
Patient called requesting a refill on her thyroid medication [facility] authorized

## 2015-02-12 ENCOUNTER — Other Ambulatory Visit: Payer: Self-pay

## 2015-02-12 MED ORDER — LEVOTHYROXINE SODIUM 75 MCG PO TABS: 75.0000 ug | ORAL_TABLET | Freq: Every day | ORAL | Status: DC

## 2015-03-13 ENCOUNTER — Other Ambulatory Visit: Payer: Self-pay | Admitting: Internal Medicine

## 2015-03-14 ENCOUNTER — Telehealth: Payer: Self-pay

## 2015-03-14 MED ORDER — ATORVASTATIN CALCIUM 10 MG PO TABS: 10.0000 mg | ORAL_TABLET | Freq: Every day | ORAL | Status: DC

## 2015-03-14 NOTE — Telephone Encounter (Signed)
Patient had called requesting a refill on her cholesterol medication [facility]

## 2015-03-26 ENCOUNTER — Encounter: Payer: Self-pay | Admitting: Internal Medicine

## 2015-03-26 ENCOUNTER — Ambulatory Visit: Payer: Self-pay | Attending: Internal Medicine | Admitting: Internal Medicine

## 2015-03-26 VITALS — BP 125/76 | HR 84 | Temp 98.0°F | Resp 16 | Wt 163.4 lb

## 2015-03-26 DIAGNOSIS — M62838 Other muscle spasm: Secondary | ICD-10-CM | POA: Insufficient documentation

## 2015-03-26 DIAGNOSIS — E559 Vitamin D deficiency, unspecified: Secondary | ICD-10-CM | POA: Insufficient documentation

## 2015-03-26 DIAGNOSIS — Z87891 Personal history of nicotine dependence: Secondary | ICD-10-CM | POA: Insufficient documentation

## 2015-03-26 DIAGNOSIS — I10 Essential (primary) hypertension: Secondary | ICD-10-CM | POA: Insufficient documentation

## 2015-03-26 DIAGNOSIS — E039 Hypothyroidism, unspecified: Secondary | ICD-10-CM | POA: Insufficient documentation

## 2015-03-26 DIAGNOSIS — E785 Hyperlipidemia, unspecified: Secondary | ICD-10-CM | POA: Insufficient documentation

## 2015-03-26 LAB — COMPLETE METABOLIC PANEL WITH GFR
ALBUMIN: 4.4 g/dL (ref 3.5–5.2)
ALK PHOS: 87 U/L (ref 39–117)
ALT: 13 U/L (ref 0–35)
AST: 19 U/L (ref 0–37)
BUN: 17 mg/dL (ref 6–23)
CHLORIDE: 104 meq/L (ref 96–112)
CO2: 25 mEq/L (ref 19–32)
CREATININE: 0.89 mg/dL (ref 0.50–1.10)
Calcium: 10 mg/dL (ref 8.4–10.5)
GFR, Est African American: 79 mL/min
GFR, Est Non African American: 69 mL/min
Glucose, Bld: 78 mg/dL (ref 70–99)
Potassium: 3.9 mEq/L (ref 3.5–5.3)
SODIUM: 139 meq/L (ref 135–145)
Total Bilirubin: 0.5 mg/dL (ref 0.2–1.2)
Total Protein: 7.4 g/dL (ref 6.0–8.3)

## 2015-03-26 LAB — TSH: TSH: 2.832 u[IU]/mL (ref 0.350–4.500)

## 2015-03-26 MED ORDER — CYCLOBENZAPRINE HCL 10 MG PO TABS: 10.0000 mg | ORAL_TABLET | Freq: Every day | ORAL | Status: DC

## 2015-03-26 MED ORDER — ALPRAZOLAM 1 MG PO TABS: 1.0000 mg | ORAL_TABLET | Freq: Three times a day (TID) | ORAL | Status: DC | PRN

## 2015-03-26 NOTE — Progress Notes (Signed)
MRN: [id number] Name: Kimberly Blanchard  Sex: female Age: 64 y.o. DOB: 11/28/1950  Allergies: Mobic  Chief Complaint  Patient presents with  . Follow-up    HPI: Patient is 64 y.o. female who history of hypothyroidism,hypertension hyperlipidemia, chronic back pain, anxiety, patient comes today for followup, previous blood work reviewed with the patient, she reported to have been following up with pain management where she gets pain medication as well as Xanax for her anxiety as per patient her doctor is not available for one month and she needs the medication to help her until she seen by them. confirmed with outpatient pharmacy[facility] that she had her last prescription was last month.  Past Medical History  Diagnosis Date  . Hypertension   . Thyroid disease   . Hypercholesterolemia   . Arthritis   . Back pain   . Ectopic pregnancy     Past Surgical History  Procedure Laterality Date  . Tubal ligation    . Ectopic pregnancy surgery    . Unilateral salpingectomy      R/T ectopic pregnancy      Medication List       This list is accurate as of: 03/26/15  4:36 PM.  Always use your most recent med list.               ALPRAZolam 1 MG tablet  Commonly known as:  XANAX  Take 1 tablet (1 mg total) by mouth 3 (three) times daily as needed for sleep.     atorvastatin 10 MG tablet  Commonly known as:  LIPITOR  Take 1 tablet (10 mg total) by mouth at bedtime.     cephALEXin 500 MG capsule  Commonly known as:  KEFLEX  Take 1 capsule (500 mg total) by mouth 3 (three) times daily.     cyclobenzaprine 10 MG tablet  Commonly known as:  FLEXERIL  Take 1 tablet (10 mg total) by mouth at bedtime.     fluconazole 150 MG tablet  Commonly known as:  DIFLUCAN  Take 1 tablet (150 mg total) by mouth once.     gabapentin 100 MG capsule  Commonly known as:  NEURONTIN  TAKE 1 CAPSULE BY MOUTH 3 TIMES DAILY.     HYDROcodone-acetaminophen 10-325 MG per tablet  Commonly known  as:  NORCO  Take 1 tablet by mouth every 6 (six) hours as needed for pain.     KELP PO  Take 1 tablet by mouth daily.     levothyroxine 75 MCG tablet  Commonly known as:  LEVOTHROID  Take 1 tablet (75 mcg total) by mouth daily before breakfast.     levothyroxine 75 MCG tablet  Commonly known as:  SYNTHROID, LEVOTHROID  Take 1 tablet (75 mcg total) by mouth daily.     methocarbamol 750 MG tablet  Commonly known as:  ROBAXIN  TAKE 1 TABLET BY MOUTH EVERY 6 HOURS AS NEEDED FOR MUSCLE SPASMS.     promethazine 25 MG tablet  Commonly known as:  PHENERGAN  TAKE 1 TABLET BY MOUTH EVERY 6 HOURS AS NEEDED FOR NAUSEA.     triamterene-hydrochlorothiazide 37.5-25 MG per tablet  Commonly known as:  MAXZIDE-25  Take 1 tablet by mouth daily.     Vitamin D (Ergocalciferol) 50000 UNITS Caps capsule  Commonly known as:  DRISDOL  Take 1 capsule (50,000 Units total) by mouth every 7 (seven) days.        Meds ordered this encounter  Medications  .  cyclobenzaprine (FLEXERIL) 10 MG tablet    Sig: Take 1 tablet (10 mg total) by mouth at bedtime.    Dispense:  30 tablet    Refill:  0  . ALPRAZolam (XANAX) 1 MG tablet    Sig: Take 1 tablet (1 mg total) by mouth 3 (three) times daily as needed for sleep.    Dispense:  30 tablet    Refill:  0    Immunization History  Administered Date(s) Administered  . Influenza,inj,Quad PF,36+ Mos 12/05/2013, 08/09/2014  . Td 11/03/1996  . Tdap 08/09/2014    Family History  Problem Relation Age of Onset  . Diabetes Mother   . Hypertension Mother   . Diabetes Sister   . Hypertension Sister   . Hypertension Maternal Grandmother   . Colon cancer Neg Hx     History  Substance Use Topics  . Smoking status: Former Games developermoker  . Smokeless tobacco: Never Used  . Alcohol Use: No    Review of Systems   As noted in HPI  Filed Vitals:   03/26/15 1520  BP: 125/76  Pulse: 84  Temp: 98 F (36.7 C)  Resp: 16    Physical Exam  Physical Exam    Constitutional: No distress.  Eyes: EOM are normal. Pupils are equal, round, and reactive to light.  Cardiovascular: Normal rate and regular rhythm.   Pulmonary/Chest: Breath sounds normal. No respiratory distress. She has no wheezes. She has no rales.    CBC    Component Value Date/Time   WBC 5.7 10/07/2013 1746   RBC 4.64 10/07/2013 1746   HGB 12.6 10/07/2013 1746   HCT 36.9 10/07/2013 1746   PLT 286 10/07/2013 1746   MCV 79.5 10/07/2013 1746   LYMPHSABS 1.9 10/07/2013 1746   MONOABS 0.4 10/07/2013 1746   EOSABS 0.3 10/07/2013 1746   BASOSABS 0.1 10/07/2013 1746    CMP     Component Value Date/Time   NA 141 12/11/2014 1309   K 4.5 12/11/2014 1309   CL 107 12/11/2014 1309   CO2 26 12/11/2014 1309   GLUCOSE 80 12/11/2014 1309   BUN 18 12/11/2014 1309   CREATININE 0.94 12/11/2014 1309   CALCIUM 9.5 12/11/2014 1309   PROT 6.7 12/11/2014 1309   ALBUMIN 3.8 12/11/2014 1309   AST 21 12/11/2014 1309   ALT 17 12/11/2014 1309   ALKPHOS 79 12/11/2014 1309   BILITOT 0.4 12/11/2014 1309   GFRNONAA 64 12/11/2014 1309   GFRAA 74 12/11/2014 1309    Lab Results  Component Value Date/Time   CHOL 145 12/11/2014 01:09 PM    No results found for: HGBA1C  Lab Results  Component Value Date/Time   AST 21 12/11/2014 01:09 PM    Assessment and Plan  Essential hypertension, benign - Plan: blood pressure is well controlled, continue with current meds, repeat blood chemistry COMPLETE METABOLIC PANEL WITH GFR  Hypothyroidism, unspecified hypothyroidism type - Plan: currently patient is on levothyroxine75 mcg daily, recheck TSH  Vitamin D deficiency - Plan: will repeat Vit D  25 hydroxy (rtn osteoporosis monitoring)  Hyperlipidemia Advised patient for low fat diet continue with Lipitor.  Muscle spasm - Plan: cyclobenzaprine (FLEXERIL) 10 MG tablet  Patient is given prescription for Xanax 30 pills no refill for anxiety, advised patient follow up with her pain management doctor  who prescribes her this medication.   This note has been created with Education officer, environmentalDragon speech recognition software and smart phrase technology. Any transcriptional errors are unintentional.    Finnlee Guarnieri,  Vernon Prey, MD

## 2015-03-26 NOTE — Progress Notes (Signed)
Patient here for follow upon her thyroid disease and cholesterol Patient is fasting and would like her blood drawn today to check her levels and Also vitamin D Patient is requesting a prescription for her xanax due to the fact the doctor that  Normally prescribes for her is out of town for a month

## 2015-03-27 ENCOUNTER — Telehealth: Payer: Self-pay

## 2015-03-27 LAB — VITAMIN D 25 HYDROXY (VIT D DEFICIENCY, FRACTURES): VIT D 25 HYDROXY: 33 ng/mL (ref 30–100)

## 2015-03-27 NOTE — Telephone Encounter (Signed)
-----   Message from Doris Cheadleeepak Advani, MD sent at 03/27/2015  2:36 PM EDT ----- Call and let the Patient know that blood work is normal.

## 2015-03-27 NOTE — Telephone Encounter (Signed)
Patient is aware of her lab results 

## 2015-04-12 ENCOUNTER — Other Ambulatory Visit: Payer: Self-pay | Admitting: Internal Medicine

## 2015-04-13 ENCOUNTER — Other Ambulatory Visit: Payer: Self-pay

## 2015-04-13 ENCOUNTER — Telehealth: Payer: Self-pay

## 2015-04-13 MED ORDER — LEVOTHYROXINE SODIUM 75 MCG PO TABS: 75.0000 ug | ORAL_TABLET | Freq: Every day | ORAL | Status: DC

## 2015-04-13 NOTE — Telephone Encounter (Signed)
Returned patient phone call Patient not available Left message on voice mail to return our call 

## 2015-04-18 ENCOUNTER — Ambulatory Visit: Payer: Self-pay | Attending: Internal Medicine

## 2015-04-20 ENCOUNTER — Telehealth: Payer: Self-pay | Admitting: Internal Medicine

## 2015-04-20 ENCOUNTER — Telehealth: Payer: Self-pay

## 2015-04-20 ENCOUNTER — Other Ambulatory Visit: Payer: Self-pay | Admitting: Internal Medicine

## 2015-04-20 DIAGNOSIS — M62838 Other muscle spasm: Secondary | ICD-10-CM

## 2015-04-20 MED ORDER — CYCLOBENZAPRINE HCL 10 MG PO TABS: 10.0000 mg | ORAL_TABLET | Freq: Every day | ORAL | Status: DC

## 2015-04-20 NOTE — Telephone Encounter (Signed)
Pt called requesting medication refill for cyclobenzaprine (FLEXERIL) 10 MG tablet. Please f/u with patient

## 2015-04-20 NOTE — Telephone Encounter (Signed)
Patient called requesting a refill on her flexeril [facility]

## 2015-04-23 ENCOUNTER — Other Ambulatory Visit: Payer: Self-pay | Admitting: Internal Medicine

## 2015-04-24 ENCOUNTER — Encounter: Payer: Self-pay | Admitting: Internal Medicine

## 2015-04-24 ENCOUNTER — Ambulatory Visit: Payer: Self-pay | Attending: Internal Medicine | Admitting: Internal Medicine

## 2015-04-24 VITALS — BP 134/76 | HR 96 | Temp 98.5°F | Resp 15 | Wt 163.8 lb

## 2015-04-24 DIAGNOSIS — Z87891 Personal history of nicotine dependence: Secondary | ICD-10-CM | POA: Insufficient documentation

## 2015-04-24 DIAGNOSIS — Z79891 Long term (current) use of opiate analgesic: Secondary | ICD-10-CM | POA: Insufficient documentation

## 2015-04-24 DIAGNOSIS — F411 Generalized anxiety disorder: Secondary | ICD-10-CM | POA: Insufficient documentation

## 2015-04-24 DIAGNOSIS — M1990 Unspecified osteoarthritis, unspecified site: Secondary | ICD-10-CM | POA: Insufficient documentation

## 2015-04-24 DIAGNOSIS — E079 Disorder of thyroid, unspecified: Secondary | ICD-10-CM | POA: Insufficient documentation

## 2015-04-24 DIAGNOSIS — M62838 Other muscle spasm: Secondary | ICD-10-CM | POA: Insufficient documentation

## 2015-04-24 DIAGNOSIS — K5900 Constipation, unspecified: Secondary | ICD-10-CM | POA: Insufficient documentation

## 2015-04-24 DIAGNOSIS — E780 Pure hypercholesterolemia: Secondary | ICD-10-CM | POA: Insufficient documentation

## 2015-04-24 DIAGNOSIS — I10 Essential (primary) hypertension: Secondary | ICD-10-CM | POA: Insufficient documentation

## 2015-04-24 MED ORDER — ALPRAZOLAM 1 MG PO TABS: 1.0000 mg | ORAL_TABLET | Freq: Every day | ORAL | Status: AC | PRN

## 2015-04-24 MED ORDER — MAGNESIUM HYDROXIDE 400 MG/5ML PO SUSP: 5.0000 mL | Freq: Every day | ORAL | Status: DC | PRN

## 2015-04-24 NOTE — Progress Notes (Signed)
MRN: [id number] Name: Kimberly Blanchard  Sex: female Age: 64 y.o. DOB: 03-Dec-1950  Allergies: Mobic  Chief Complaint  Patient presents with  . Spasms    HPI: Patient is 64 y.o. female who has history of hypertension, chronic back pain, anxiety, muscle spasm, today she reports that her pain management doctor is not back who has been prescribing her pain medication and Xanax, as per patient she has started contacting different pain management office and is in the process until then she needs refill on her Xanax which she takes when necessary, has chronic arthritis and low back spasm she takes Flexeril at night.patient is also complaining of constipation  Past Medical History  Diagnosis Date  . Hypertension   . Thyroid disease   . Hypercholesterolemia   . Arthritis   . Back pain   . Ectopic pregnancy     Past Surgical History  Procedure Laterality Date  . Tubal ligation    . Ectopic pregnancy surgery    . Unilateral salpingectomy      R/T ectopic pregnancy      Medication List       This list is accurate as of: 04/24/15  4:38 PM.  Always use your most recent med list.               ALPRAZolam 1 MG tablet  Commonly known as:  XANAX  Take 1 tablet (1 mg total) by mouth daily as needed for anxiety or sleep.     atorvastatin 10 MG tablet  Commonly known as:  LIPITOR  Take 1 tablet (10 mg total) by mouth at bedtime.     cephALEXin 500 MG capsule  Commonly known as:  KEFLEX  Take 1 capsule (500 mg total) by mouth 3 (three) times daily.     cyclobenzaprine 10 MG tablet  Commonly known as:  FLEXERIL  Take 1 tablet (10 mg total) by mouth at bedtime.     fluconazole 150 MG tablet  Commonly known as:  DIFLUCAN  Take 1 tablet (150 mg total) by mouth once.     gabapentin 100 MG capsule  Commonly known as:  NEURONTIN  TAKE 1 CAPSULE BY MOUTH 3 TIMES DAILY.     HYDROcodone-acetaminophen 10-325 MG per tablet  Commonly known as:  NORCO  Take 1 tablet by mouth every 6  (six) hours as needed for pain.     KELP PO  Take 1 tablet by mouth daily.     levothyroxine 75 MCG tablet  Commonly known as:  LEVOTHROID  Take 1 tablet (75 mcg total) by mouth daily before breakfast.     levothyroxine 75 MCG tablet  Commonly known as:  SYNTHROID, LEVOTHROID  Take 1 tablet (75 mcg total) by mouth daily.     magnesium hydroxide 400 MG/5ML suspension  Commonly known as:  MILK OF MAGNESIA  Take 5 mLs by mouth daily as needed for mild constipation.     methocarbamol 750 MG tablet  Commonly known as:  ROBAXIN  TAKE 1 TABLET BY MOUTH EVERY 6 HOURS AS NEEDED FOR MUSCLE SPASMS.     promethazine 25 MG tablet  Commonly known as:  PHENERGAN  TAKE 1 TABLET BY MOUTH EVERY 6 HOURS AS NEEDED FOR NAUSEA.     triamterene-hydrochlorothiazide 37.5-25 MG per tablet  Commonly known as:  MAXZIDE-25  Take 1 tablet by mouth daily.     Vitamin D (Ergocalciferol) 50000 UNITS Caps capsule  Commonly known as:  DRISDOL  Take 1 capsule (50,000  Units total) by mouth every 7 (seven) days.        Meds ordered this encounter  Medications  . ALPRAZolam (XANAX) 1 MG tablet    Sig: Take 1 tablet (1 mg total) by mouth daily as needed for anxiety or sleep.    Dispense:  30 tablet    Refill:  0  . magnesium hydroxide (MILK OF MAGNESIA) 400 MG/5ML suspension    Sig: Take 5 mLs by mouth daily as needed for mild constipation.    Dispense:  360 mL    Refill:  0    Immunization History  Administered Date(s) Administered  . Influenza,inj,Quad PF,36+ Mos 12/05/2013, 08/09/2014  . Td 11/03/1996  . Tdap 08/09/2014    Family History  Problem Relation Age of Onset  . Diabetes Mother   . Hypertension Mother   . Diabetes Sister   . Hypertension Sister   . Hypertension Maternal Grandmother   . Colon cancer Neg Hx     History  Substance Use Topics  . Smoking status: Former Games developer  . Smokeless tobacco: Never Used  . Alcohol Use: No    Review of Systems   As noted in HPI  Filed  Vitals:   04/24/15 1523  BP: 134/76  Pulse: 96  Temp: 98.5 F (36.9 C)  Resp: 15    Physical Exam  Physical Exam  Constitutional: No distress.  Cardiovascular: Normal rate and regular rhythm.   Pulmonary/Chest: Breath sounds normal. No respiratory distress. She has no wheezes. She has no rales.  Abdominal: Soft. There is no tenderness. There is no rebound.  Musculoskeletal:  Lower lumbar paraspinal tenderness    CBC    Component Value Date/Time   WBC 5.7 10/07/2013 1746   RBC 4.64 10/07/2013 1746   HGB 12.6 10/07/2013 1746   HCT 36.9 10/07/2013 1746   PLT 286 10/07/2013 1746   MCV 79.5 10/07/2013 1746   LYMPHSABS 1.9 10/07/2013 1746   MONOABS 0.4 10/07/2013 1746   EOSABS 0.3 10/07/2013 1746   BASOSABS 0.1 10/07/2013 1746    CMP     Component Value Date/Time   NA 139 03/26/2015 1548   K 3.9 03/26/2015 1548   CL 104 03/26/2015 1548   CO2 25 03/26/2015 1548   GLUCOSE 78 03/26/2015 1548   BUN 17 03/26/2015 1548   CREATININE 0.89 03/26/2015 1548   CALCIUM 10.0 03/26/2015 1548   PROT 7.4 03/26/2015 1548   ALBUMIN 4.4 03/26/2015 1548   AST 19 03/26/2015 1548   ALT 13 03/26/2015 1548   ALKPHOS 87 03/26/2015 1548   BILITOT 0.5 03/26/2015 1548   GFRNONAA 69 03/26/2015 1548   GFRAA 79 03/26/2015 1548    Lab Results  Component Value Date/Time   CHOL 145 12/11/2014 01:09 PM    No results found for: HGBA1C  Lab Results  Component Value Date/Time   AST 19 03/26/2015 03:48 PM    Assessment and Plan  Muscle spasm Advised patient to apply heating pad, continue with Flexeril  Constipation, unspecified constipation type - Plan: Advised patient for high fiber diet, trial of magnesium hydroxide (MILK OF MAGNESIA) 400 MG/5ML suspension  Anxiety state - Plan:patient used to get Xanax from her pain management doctor, she's in the process to establish with a new practice she is given one refill.    Return in about 3 months (around 07/25/2015), or if symptoms worsen  or fail to improve.   This note has been created with Education officer, environmental.  Any transcriptional errors are unintentional.    Lorayne Marek, MD

## 2015-04-24 NOTE — Progress Notes (Signed)
Patient complains of muscle spasms to her lower back and pelvic area Patient states this has been going on for a long time Patient takes flexeril at night but needs something for during the day

## 2015-05-14 ENCOUNTER — Other Ambulatory Visit: Payer: Self-pay | Admitting: Internal Medicine

## 2015-05-15 NOTE — Telephone Encounter (Signed)
Patient called requesting medication refill for bp and thyroid , please f/u with patient

## 2015-05-16 ENCOUNTER — Telehealth: Payer: Self-pay

## 2015-05-16 ENCOUNTER — Other Ambulatory Visit: Payer: Self-pay

## 2015-05-16 ENCOUNTER — Other Ambulatory Visit: Payer: Self-pay | Admitting: *Deleted

## 2015-05-16 DIAGNOSIS — M62838 Other muscle spasm: Secondary | ICD-10-CM

## 2015-05-16 DIAGNOSIS — I10 Essential (primary) hypertension: Secondary | ICD-10-CM

## 2015-05-16 MED ORDER — LEVOTHYROXINE SODIUM 75 MCG PO TABS: 75.0000 ug | ORAL_TABLET | Freq: Every day | ORAL | Status: DC

## 2015-05-16 MED ORDER — TRIAMTERENE-HCTZ 37.5-25 MG PO TABS: 1.0000 | ORAL_TABLET | Freq: Every day | ORAL | Status: DC

## 2015-05-16 MED ORDER — CYCLOBENZAPRINE HCL 10 MG PO TABS: 10.0000 mg | ORAL_TABLET | Freq: Every day | ORAL | Status: DC

## 2015-05-16 MED ORDER — ATORVASTATIN CALCIUM 10 MG PO TABS: 10.0000 mg | ORAL_TABLET | Freq: Every day | ORAL | Status: DC

## 2015-05-16 NOTE — Telephone Encounter (Signed)
Pt returning call for nurse to request refill on meds.

## 2015-05-16 NOTE — Telephone Encounter (Signed)
Returned patient phone call Patient not available Left message on voice mail to return our call 

## 2015-06-08 ENCOUNTER — Telehealth: Payer: Self-pay

## 2015-06-08 DIAGNOSIS — M62838 Other muscle spasm: Secondary | ICD-10-CM

## 2015-06-08 MED ORDER — CYCLOBENZAPRINE HCL 10 MG PO TABS: 10.0000 mg | ORAL_TABLET | Freq: Every day | ORAL | Status: DC

## 2015-06-08 NOTE — Telephone Encounter (Signed)
Patient called requesting a refill on her flexeril [facility]

## 2015-06-28 ENCOUNTER — Encounter: Payer: Self-pay | Admitting: Family Medicine

## 2015-06-28 ENCOUNTER — Ambulatory Visit: Payer: Self-pay | Attending: Family Medicine | Admitting: Family Medicine

## 2015-06-28 VITALS — BP 102/66 | HR 88 | Temp 98.6°F | Resp 16 | Ht 65.0 in | Wt 168.0 lb

## 2015-06-28 DIAGNOSIS — M25569 Pain in unspecified knee: Secondary | ICD-10-CM

## 2015-06-28 DIAGNOSIS — M25551 Pain in right hip: Secondary | ICD-10-CM | POA: Insufficient documentation

## 2015-06-28 DIAGNOSIS — M62838 Other muscle spasm: Secondary | ICD-10-CM

## 2015-06-28 DIAGNOSIS — M25552 Pain in left hip: Secondary | ICD-10-CM | POA: Insufficient documentation

## 2015-06-28 DIAGNOSIS — G8929 Other chronic pain: Secondary | ICD-10-CM | POA: Insufficient documentation

## 2015-06-28 DIAGNOSIS — M25559 Pain in unspecified hip: Secondary | ICD-10-CM

## 2015-06-28 DIAGNOSIS — E039 Hypothyroidism, unspecified: Secondary | ICD-10-CM | POA: Insufficient documentation

## 2015-06-28 DIAGNOSIS — F419 Anxiety disorder, unspecified: Secondary | ICD-10-CM | POA: Insufficient documentation

## 2015-06-28 DIAGNOSIS — M25562 Pain in left knee: Secondary | ICD-10-CM

## 2015-06-28 DIAGNOSIS — M25561 Pain in right knee: Secondary | ICD-10-CM

## 2015-06-28 DIAGNOSIS — Z87891 Personal history of nicotine dependence: Secondary | ICD-10-CM | POA: Insufficient documentation

## 2015-06-28 DIAGNOSIS — R6889 Other general symptoms and signs: Secondary | ICD-10-CM | POA: Insufficient documentation

## 2015-06-28 LAB — CBC
HCT: 35.5 % — ABNORMAL LOW (ref 36.0–46.0)
Hemoglobin: 12.1 g/dL (ref 12.0–15.0)
MCH: 27.1 pg (ref 26.0–34.0)
MCHC: 34.1 g/dL (ref 30.0–36.0)
MCV: 79.6 fL (ref 78.0–100.0)
MPV: 10.3 fL (ref 8.6–12.4)
PLATELETS: 264 10*3/uL (ref 150–400)
RBC: 4.46 MIL/uL (ref 3.87–5.11)
RDW: 15.9 % — ABNORMAL HIGH (ref 11.5–15.5)
WBC: 6 10*3/uL (ref 4.0–10.5)

## 2015-06-28 LAB — TSH: TSH: 2.945 u[IU]/mL (ref 0.350–4.500)

## 2015-06-28 MED ORDER — CYCLOBENZAPRINE HCL 10 MG PO TABS: 10.0000 mg | ORAL_TABLET | Freq: Two times a day (BID) | ORAL | Status: DC | PRN

## 2015-06-28 MED ORDER — GABAPENTIN 300 MG PO CAPS: 300.0000 mg | ORAL_CAPSULE | Freq: Two times a day (BID) | ORAL | Status: DC

## 2015-06-28 NOTE — Assessment & Plan Note (Signed)
Cold intolerance: checking TSH and CBC today

## 2015-06-28 NOTE — Patient Instructions (Addendum)
Ms. Cisek,  Thank you for coming in today. It was a pleasure meeting you. I look forward to being your primary doctor.   1. Chronic joint pain: specifically hip and bottom Exam and imaging are consistent with osteoarthritis of hip joints, suspected arthritis in sacroiliac joints and possible trochanteric bursitis  Plan: Increase flexeril to twice daily Restart gabapentin 300 mg at night for one week then twice daily   Vicodin per Dr. Ronne Binning  Stay active   Sugar free tart cherry juice is a nice natural antiinflammatory    2. Cold intolerance: checking TSH and CBC today  F/u in 1-3 weeks with nurse for flu shot F/u with me in 6-8 weeks for joint pain   Dr. Armen Pickup

## 2015-06-28 NOTE — Progress Notes (Signed)
   Subjective:    Patient ID: Kimberly Blanchard, female    DOB: Jun 26, 1951, 64 y.o.   MRN: [id number] CC: refill flexeril, chronic hip pain  HPI  1. Chronic hip pain: x 5 years. No recent injury. Has x-ray confirmed mild OA in both hip joints. Get R >L hip and gluteal pain. Taking vicodin prescribed by Dr. Ronne Binning, of note Dr. Ronne Binning also prescribes xanax. Also taking flexeril, has increase to BID. Not taking gabapentin as 100 mg TID did not help with pain. No need for assistive devices for ambulation.  2. Cold intolerance: for many months. Has known hypothyroidism. Taking synthroid daily. No history of anemia. Last CBC normal.   Social History  Substance Use Topics  . Smoking status: Former Games developer  . Smokeless tobacco: Never Used  . Alcohol Use: No   Review of Systems  Constitutional: Negative for fever and chills.  Eyes: Negative for visual disturbance.  Respiratory: Negative for shortness of breath.   Cardiovascular: Negative for chest pain.  Gastrointestinal: Negative for abdominal pain and blood in stool.  Endocrine: Positive for cold intolerance.  Musculoskeletal: Positive for myalgias and back pain. Negative for joint swelling and arthralgias.  Skin: Negative for rash.  Allergic/Immunologic: Negative for immunocompromised state.  Hematological: Negative for adenopathy. Does not bruise/bleed easily.  Psychiatric/Behavioral: Negative for suicidal ideas and dysphoric mood.      Objective:   Physical Exam  Constitutional: She is oriented to person, place, and time. She appears well-developed and well-nourished. No distress.  Pulmonary/Chest: Effort normal.  Musculoskeletal: She exhibits no edema.  Back Exam: Back: Normal Curvature, no deformities or CVA tenderness  Paraspinal Tenderness: absent   LE Strength 5/5  LE Sensation: in tact  Straight leg raise: negative Log roll: + on R FABER: + b/l Tender over greater trochanter b/l    Neurological: She is alert and oriented  to person, place, and time.  Skin: Skin is warm and dry. No rash noted.  Psychiatric: She has a normal mood and affect.  BP 102/66 mmHg  Pulse 88  Temp(Src) 98.6 F (37 C) (Oral)  Resp 16  Ht  (1.651 m)  Wt 168 lb (76.204 kg)  BMI 27.96 kg/m2  SpO2 98%        Assessment & Plan:

## 2015-06-28 NOTE — Assessment & Plan Note (Signed)
Chronic joint pain: specifically hip and bottom Exam and imaging are consistent with osteoarthritis of hip joints, suspected arthritis in sacroiliac joints and possible trochanteric bursitis  Plan: Increase flexeril to twice daily Restart gabapentin 300 mg at night for one week then twice daily   Vicodin per Dr. Ronne Binning  Stay active   Sugar free tart cherry juice is a nice natural antiinflammatory

## 2015-06-28 NOTE — Progress Notes (Signed)
Establish Care with new PCP Stated has question about medication  F/U Cholesterol TSH

## 2015-07-04 ENCOUNTER — Telehealth: Payer: Self-pay | Admitting: *Deleted

## 2015-07-04 NOTE — Telephone Encounter (Signed)
Date of birth verified  Pt was given Normal lab results

## 2015-07-04 NOTE — Telephone Encounter (Signed)
-----   Message from Dessa Phi, MD sent at 06/29/2015 10:49 AM EDT ----- Normal TSH Normal CBC

## 2015-09-10 ENCOUNTER — Other Ambulatory Visit: Payer: Self-pay | Admitting: Internal Medicine

## 2015-11-07 ENCOUNTER — Other Ambulatory Visit: Payer: Self-pay | Admitting: Family Medicine

## 2015-11-08 MED FILL — SYNTHROID 75 MCG TABLET: 75 | 25 days supply | Qty: 25 | Fill #7

## 2015-11-08 MED FILL — CYCLOBENZAPRINE 10 MG TAB: 10 | 30 days supply | Qty: 60 | Fill #0

## 2015-11-08 MED FILL — GABAPENTIN 300 MG CAPSULE: 300 | 30 days supply | Qty: 60 | Fill #0

## 2015-11-08 MED FILL — ATORVASTATIN 10 MG TABLET: 10 | 30 days supply | Qty: 30 | Fill #2

## 2015-11-08 MED FILL — TRIAMTERENE-HCTZ 37.5-25 MG: 37.5-25 | 30 days supply | Qty: 30 | Fill #2

## 2015-11-26 MED FILL — ALPRAZolam 1 MG TABS: 1 | 30 days supply | Qty: 90 | Fill #2

## 2015-11-26 MED FILL — HYDROCODON-APAP 10-325: 10-325 | 30 days supply | Qty: 120 | Fill #0

## 2015-12-04 ENCOUNTER — Other Ambulatory Visit: Payer: Self-pay | Admitting: Family Medicine

## 2015-12-04 MED FILL — TRIAMTERENE-HCTZ 37.5-25 MG: 37.5-25 | 30 days supply | Qty: 30 | Fill #3

## 2015-12-04 MED FILL — ATORVASTATIN 10 MG TABLET: 10 | 30 days supply | Qty: 30 | Fill #0

## 2015-12-04 MED FILL — SYNTHROID 75 MCG TABLET: 75 | 25 days supply | Qty: 25 | Fill #8

## 2015-12-04 MED FILL — GABAPENTIN 300 MG CAPSULE: 300 | 30 days supply | Qty: 60 | Fill #0

## 2015-12-04 MED FILL — CYCLOBENZAPRINE 10 MG TAB: 10 | 30 days supply | Qty: 60 | Fill #0

## 2015-12-26 MED FILL — HYDROCODON-APAP 10-325: 10-325 | 30 days supply | Qty: 120 | Fill #0

## 2015-12-26 MED FILL — ALPRAZolam 1 MG TABS: 1 | 30 days supply | Qty: 90 | Fill #3

## 2016-01-01 MED FILL — !SYNTHROID 75 MCG TABLET: 75 | 5 days supply | Qty: 5 | Fill #0

## 2016-01-07 ENCOUNTER — Other Ambulatory Visit: Payer: Self-pay | Admitting: Internal Medicine

## 2016-01-07 ENCOUNTER — Other Ambulatory Visit: Payer: Self-pay | Admitting: Family Medicine

## 2016-01-07 MED FILL — ATORVASTATIN 10 MG TABLET: 10 | 30 days supply | Qty: 30 | Fill #1

## 2016-01-08 ENCOUNTER — Other Ambulatory Visit: Payer: Self-pay | Admitting: Family Medicine

## 2016-01-08 MED FILL — SYNTHROID 75 MCG TABLET: 75 | 30 days supply | Qty: 30 | Fill #9

## 2016-01-11 ENCOUNTER — Telehealth: Payer: Self-pay | Admitting: Family Medicine

## 2016-01-11 ENCOUNTER — Other Ambulatory Visit: Payer: Self-pay | Admitting: Family Medicine

## 2016-01-11 ENCOUNTER — Other Ambulatory Visit: Payer: Self-pay | Admitting: Internal Medicine

## 2016-01-11 DIAGNOSIS — I10 Essential (primary) hypertension: Secondary | ICD-10-CM

## 2016-01-11 MED ORDER — GABAPENTIN 300 MG PO CAPS: ORAL_CAPSULE | ORAL | Status: DC

## 2016-01-11 MED ORDER — CYCLOBENZAPRINE HCL 10 MG PO TABS: ORAL_TABLET | ORAL | Status: AC

## 2016-01-11 MED ORDER — TRIAMTERENE-HCTZ 37.5-25 MG PO TABS: 1.0000 | ORAL_TABLET | Freq: Every day | ORAL | Status: DC

## 2016-01-11 NOTE — Telephone Encounter (Signed)
Rx refills send to [facility]  Pt has F/U appointment

## 2016-01-11 NOTE — Telephone Encounter (Signed)
Patient came in requesting a medication refill for  Flexeril, gabapentin and triamterene-hydrochlorothiazide. Please follow up.

## 2016-01-14 MED FILL — CYCLOBENZAPRINE 10 MG TAB: 10 | 30 days supply | Qty: 60 | Fill #0

## 2016-01-14 MED FILL — TRIAMTERENE-HCTZ 37.5-25 MG: 37.5-25 | 30 days supply | Qty: 30 | Fill #0

## 2016-01-14 MED FILL — GABAPENTIN 300 MG CAPSULE: 300 | 30 days supply | Qty: 60 | Fill #0

## 2016-01-16 ENCOUNTER — Ambulatory Visit (HOSPITAL_BASED_OUTPATIENT_CLINIC_OR_DEPARTMENT_OTHER): Payer: PPO | Admitting: Family Medicine

## 2016-01-16 ENCOUNTER — Other Ambulatory Visit: Payer: Self-pay | Admitting: Family Medicine

## 2016-01-16 ENCOUNTER — Encounter: Payer: Self-pay | Admitting: Family Medicine

## 2016-01-16 ENCOUNTER — Ambulatory Visit (HOSPITAL_COMMUNITY)
Admission: RE | Admit: 2016-01-16 | Discharge: 2016-01-16 | Disposition: A | Discharge status: Home / Self Care | Payer: PPO | Source: Ambulatory Visit | Attending: Family Medicine | Admitting: Family Medicine

## 2016-01-16 VITALS — BP 120/76 | HR 84 | Temp 98.3°F | Resp 16 | Ht 65.0 in | Wt 170.0 lb

## 2016-01-16 DIAGNOSIS — N2889 Other specified disorders of kidney and ureter: Secondary | ICD-10-CM | POA: Diagnosis not present

## 2016-01-16 DIAGNOSIS — M47897 Other spondylosis, lumbosacral region: Secondary | ICD-10-CM | POA: Insufficient documentation

## 2016-01-16 DIAGNOSIS — M5440 Lumbago with sciatica, unspecified side: Secondary | ICD-10-CM

## 2016-01-16 DIAGNOSIS — M545 Low back pain, unspecified: Secondary | ICD-10-CM | POA: Insufficient documentation

## 2016-01-16 DIAGNOSIS — M47896 Other spondylosis, lumbar region: Secondary | ICD-10-CM | POA: Insufficient documentation

## 2016-01-16 DIAGNOSIS — M4316 Spondylolisthesis, lumbar region: Secondary | ICD-10-CM | POA: Insufficient documentation

## 2016-01-16 MED ORDER — GABAPENTIN 300 MG PO CAPS: ORAL_CAPSULE | ORAL | Status: AC

## 2016-01-16 MED ORDER — ONDANSETRON HCL 4 MG PO TABS: 4.0000 mg | ORAL_TABLET | Freq: Three times a day (TID) | ORAL | Status: AC | PRN

## 2016-01-16 MED ORDER — NAPROXEN 500 MG PO TABS: 500.0000 mg | ORAL_TABLET | Freq: Two times a day (BID) | ORAL | Status: AC

## 2016-01-16 MED FILL — ONDANSETRON HCL 4 MG TABLET: 4 | 2 days supply | Qty: 5 | Fill #0

## 2016-01-16 NOTE — Addendum Note (Signed)
Addended by: Barbaraann BarthelBREEN, Chasidy Janak O on: 01/16/2016 04:29 PM   Modules accepted: Orders

## 2016-01-16 NOTE — Patient Instructions (Signed)
It is a pleasure to see you today.     As we discussed, for your back and hip pain, I am ordering x-rays of the hips and lumbosacral spine.  We will follow up when the x-ray results are back.   I recommend an increase in the gabapentin 300mg  capsules to 1 capsule by mouth in the morning, and 2 capsules at bedtime.    Follow up with Dr Armen PickupFunches in the coming 4 to 6 weeks to see how you are doing.

## 2016-01-16 NOTE — Addendum Note (Signed)
Addended by: Barbaraann BarthelBREEN, Emiel Kielty O on: 01/16/2016 06:06 PM   Modules accepted: Orders
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
## 2016-01-17 MED FILL — NAPROXEN 500 MG TABLET: 500 | 15 days supply | Qty: 30 | Fill #0

## 2016-01-18 ENCOUNTER — Telehealth: Payer: Self-pay

## 2016-01-18 NOTE — Telephone Encounter (Signed)
-----   Message from Barbaraann BarthelJames O Breen, MD sent at 01/16/2016  6:05 PM EDT ----- Please let patient know that her xrays show degenerative joint disease ("degenerative arthritis").  May take Naproxen 500mg  one tablet by mouth twice daily as needed for pain.  Follow up with Dr. Armen PickupFunches.  Paula ComptonJames Breen, MD

## 2016-01-18 NOTE — Telephone Encounter (Signed)
CMA called Kimberly Blanchard, Kimberly Blanchard verified name and DOB. Kimberly Blanchard was given x-ray results. Per Dr. Mauricio PoBreen, Kimberly Blanchard needs a f/up appt, so Kimberly Blanchard was transferred to the front to make an appt. Kimberly Blanchard verbalized she understood her results with no further questions.

## 2016-01-23 ENCOUNTER — Other Ambulatory Visit: Payer: Self-pay | Admitting: Family Medicine

## 2016-01-23 MED FILL — HYDROCODON-APAP 10-325: 10-325 | 30 days supply | Qty: 120 | Fill #0

## 2016-01-23 MED FILL — ALPRAZolam 1 MG TABS: 1 | 30 days supply | Qty: 90 | Fill #0

## 2016-01-24 ENCOUNTER — Other Ambulatory Visit: Payer: Self-pay

## 2016-01-24 DIAGNOSIS — Z1231 Encounter for screening mammogram for malignant neoplasm of breast: Secondary | ICD-10-CM

## 2016-02-05 MED FILL — SYNTHROID 75 MCG TABLET: 75 | 30 days supply | Qty: 30 | Fill #10 | Status: TO

## 2016-02-05 MED FILL — ATORVASTATIN 10 MG TABLET: 10 | 30 days supply | Qty: 30 | Fill #2

## 2016-02-11 ENCOUNTER — Other Ambulatory Visit: Payer: Self-pay | Admitting: Family Medicine

## 2016-02-11 MED FILL — TRIAMTERENE-HCTZ 37.5-25 MG: 37.5-25 | 30 days supply | Qty: 30 | Fill #0 | Status: TO

## 2016-02-14 ENCOUNTER — Ambulatory Visit
Admission: RE | Admit: 2016-02-14 | Discharge: 2016-02-14 | Disposition: A | Discharge status: Home / Self Care | Payer: PPO | Source: Ambulatory Visit

## 2016-02-14 DIAGNOSIS — Z1231 Encounter for screening mammogram for malignant neoplasm of breast: Secondary | ICD-10-CM

## 2016-02-19 ENCOUNTER — Encounter: Payer: Self-pay | Admitting: Family Medicine

## 2016-02-19 ENCOUNTER — Ambulatory Visit: Payer: PPO | Attending: Family Medicine | Admitting: Family Medicine

## 2016-02-19 VITALS — Resp 16 | Ht 65.0 in | Wt 164.0 lb

## 2016-02-19 DIAGNOSIS — E039 Hypothyroidism, unspecified: Secondary | ICD-10-CM

## 2016-02-19 NOTE — Progress Notes (Signed)
F/U Hip pain  Pain scale #7 Requesting mammogram, xray results  Results given to pt   Pt stated has a PCP that provided  her with Hydrocodone and Xanax  Advised to F/U with PCP since is best to continue all health  care at one place

## 2016-02-19 NOTE — Progress Notes (Signed)
Patient ID: Monica BectonSalinda Pociask, female   DOB: [birth year], 65 y.o.   MRN: [id number] Agree with my RMA. Patient should have 1 PCP.  She has opted to continue with her other PCP who also prescribes vicodin and xanax.  Patient advised to schedule with other PCP for f/u hip pain and diabetes screening. She was informed that there would be no charge for this OV.

## 2016-02-22 MED FILL — ALPRAZolam 1 MG TABS: 1 | 30 days supply | Qty: 90 | Fill #1

## 2016-02-22 MED FILL — HYDROCODON-APAP 10-325: 10-325 | 30 days supply | Qty: 120 | Fill #0

## 2016-03-03 ENCOUNTER — Other Ambulatory Visit: Payer: Self-pay | Admitting: Family Medicine

## 2016-03-03 MED FILL — SYNTHROID 75 MCG TABLET: 75 | 30 days supply | Qty: 30 | Fill #0

## 2016-03-07 MED FILL — NAPROXEN 500 MG TABLET: 500 | 30 days supply | Qty: 60 | Fill #0

## 2016-03-07 MED FILL — CYCLOBENZAPRINE 10 MG TAB: 10 | 10 days supply | Qty: 20 | Fill #0

## 2016-03-07 MED FILL — ATORVASTATIN 10 MG TABLET: 10 | 30 days supply | Qty: 30 | Fill #0

## 2016-03-11 MED FILL — TRIAMTERENE/HCTZ 37.5/25 TB: 37.5-25 | 30 days supply | Qty: 30 | Fill #0

## 2016-03-25 MED FILL — ALPRAZolam 1 MG TABS: 1 | 30 days supply | Qty: 90 | Fill #2

## 2016-03-25 MED FILL — HYDROCODON-APAP 10-325: 10-325 | 30 days supply | Qty: 120 | Fill #0

## 2016-04-02 MED FILL — NAPROXEN 500 MG TABLET: 500 | 30 days supply | Qty: 60 | Fill #1

## 2016-04-02 MED FILL — SYNTHROID 75 MCG TABLET: 75 | 10 days supply | Qty: 10 | Fill #1

## 2016-04-02 MED FILL — ATORVASTATIN 10 MG TABLET: 10 | 30 days supply | Qty: 30 | Fill #1

## 2016-04-02 MED FILL — CYCLOBENZAPRINE 10 MG TAB: 10 | 10 days supply | Qty: 20 | Fill #1

## 2016-04-03 MED FILL — TRIAMTERENE/HCTZ 37.5/25 TB: 37.5-25 | 30 days supply | Qty: 30 | Fill #1

## 2016-04-04 MED FILL — SYNTHROID 75 MCG TABLET: 75 | 30 days supply | Qty: 30 | Fill #0

## 2016-04-09 ENCOUNTER — Other Ambulatory Visit: Payer: Self-pay | Admitting: *Deleted

## 2016-04-09 MED ORDER — LEVOTHYROXINE SODIUM 75 MCG PO TABS: 75.0000 ug | ORAL_TABLET | Freq: Every day | ORAL | Status: AC

## 2016-04-25 DIAGNOSIS — M545 Low back pain: Secondary | ICD-10-CM | POA: Diagnosis not present

## 2016-04-25 MED FILL — HYDROCODON-APAP 10-325: 10-325 | 30 days supply | Qty: 120 | Fill #0

## 2016-04-25 MED FILL — ALPRAZolam 1 MG TABS: 1 | 30 days supply | Qty: 90 | Fill #3

## 2016-05-02 MED FILL — ATORVASTATIN 10 MG TABLET: 10 | 30 days supply | Qty: 30 | Fill #2

## 2016-05-02 MED FILL — CYCLOBENZAPRINE 10 MG TAB: 10 | 10 days supply | Qty: 20 | Fill #2

## 2016-05-13 MED FILL — SYNTHROID 75 MCG TABLET: 75 | 30 days supply | Qty: 30 | Fill #1

## 2016-05-13 MED FILL — TRIAMTERENE/HCTZ 37.5/25 TB: 37.5-25 | 30 days supply | Qty: 30 | Fill #2

## 2016-05-13 MED FILL — CYCLOBENZAPRINE 10 MG TAB: 10 | 10 days supply | Qty: 20 | Fill #3

## 2016-05-13 MED FILL — NAPROXEN 500 MG TABLET: 500 | 30 days supply | Qty: 60 | Fill #2

## 2016-05-26 DIAGNOSIS — M0210 Postdysenteric arthropathy, unspecified site: Secondary | ICD-10-CM | POA: Diagnosis not present

## 2016-05-26 DIAGNOSIS — A049 Bacterial intestinal infection, unspecified: Secondary | ICD-10-CM | POA: Diagnosis not present

## 2016-05-26 DIAGNOSIS — R531 Weakness: Secondary | ICD-10-CM | POA: Diagnosis not present

## 2016-05-26 DIAGNOSIS — M545 Low back pain: Secondary | ICD-10-CM | POA: Diagnosis not present

## 2016-05-26 MED FILL — HYDROCODON-APAP 10-325: 10-325 | 30 days supply | Qty: 120 | Fill #0

## 2016-05-26 MED FILL — ALPRAZolam 1 MG TABS: 1 | 30 days supply | Qty: 90 | Fill #4

## 2016-05-26 MED FILL — CYCLOBENZAPRINE 10 MG TAB: 10 | 30 days supply | Qty: 60 | Fill #0

## 2016-06-03 MED FILL — ATORVASTATIN 10 MG TABLET: 10 | 30 days supply | Qty: 30 | Fill #3

## 2016-06-13 ENCOUNTER — Other Ambulatory Visit: Payer: Self-pay | Admitting: Family Medicine

## 2016-06-13 MED FILL — HYDROCHLOROTHIAZIDE 25 MG T: 25 | 30 days supply | Qty: 30 | Fill #0

## 2016-06-13 MED FILL — SYNTHROID 75 MCG TABLET: 75 | 30 days supply | Qty: 30 | Fill #2

## 2016-06-25 MED FILL — ALPRAZolam 1 MG TABS: 1 | 30 days supply | Qty: 90 | Fill #5

## 2016-06-25 MED FILL — CYCLOBENZAPRINE 10 MG TAB: 10 | 30 days supply | Qty: 60 | Fill #1

## 2016-06-26 MED FILL — HYDROCODON-APAP 10-325: 10-325 | 30 days supply | Qty: 120 | Fill #0

## 2016-07-01 MED FILL — ATORVASTATIN 10 MG TABLET: 10 | 30 days supply | Qty: 30 | Fill #4

## 2016-07-14 MED FILL — HYDROCHLOROTHIAZIDE 25 MG T: 25 | 30 days supply | Qty: 30 | Fill #1

## 2016-07-14 MED FILL — SYNTHROID 75 MCG TABLET: 75 | 30 days supply | Qty: 30 | Fill #3

## 2016-07-25 DIAGNOSIS — M545 Low back pain: Secondary | ICD-10-CM | POA: Diagnosis not present

## 2016-07-25 DIAGNOSIS — M0210 Postdysenteric arthropathy, unspecified site: Secondary | ICD-10-CM | POA: Diagnosis not present

## 2016-07-25 DIAGNOSIS — A049 Bacterial intestinal infection, unspecified: Secondary | ICD-10-CM | POA: Diagnosis not present

## 2016-07-25 DIAGNOSIS — R531 Weakness: Secondary | ICD-10-CM | POA: Diagnosis not present

## 2016-07-25 MED FILL — CYCLOBENZAPRINE 10 MG TAB: 10 | 30 days supply | Qty: 60 | Fill #2

## 2016-07-25 MED FILL — ALPRAZolam 1 MG TABS: 1 | 30 days supply | Qty: 90 | Fill #0

## 2016-07-25 MED FILL — HYDROCODON-APAP 10-325: 10-325 | 30 days supply | Qty: 120 | Fill #0

## 2016-08-01 MED FILL — ATORVASTATIN 10 MG TABLET: 10 | 30 days supply | Qty: 30 | Fill #5

## 2016-08-14 MED FILL — HYDROCHLOROTHIAZIDE 25 MG T: 25 | 30 days supply | Qty: 30 | Fill #2

## 2016-08-14 MED FILL — SYNTHROID 75 MCG TABLET: 75 | 30 days supply | Qty: 30 | Fill #4

## 2016-08-25 DIAGNOSIS — M0210 Postdysenteric arthropathy, unspecified site: Secondary | ICD-10-CM | POA: Diagnosis not present

## 2016-08-25 DIAGNOSIS — M545 Low back pain: Secondary | ICD-10-CM | POA: Diagnosis not present

## 2016-08-25 MED FILL — HYDROCODON-APAP 10-325: 10-325 | 30 days supply | Qty: 120 | Fill #0

## 2016-08-25 MED FILL — ALPRAZolam 1 MG TABS: 1 | 30 days supply | Qty: 90 | Fill #1

## 2016-08-25 MED FILL — CYCLOBENZAPRINE 10 MG TAB: 10 | 30 days supply | Qty: 60 | Fill #3

## 2016-09-01 MED FILL — NAPROXEN 500 MG TABLET: 500 | 30 days supply | Qty: 60 | Fill #3

## 2016-09-02 MED FILL — ATORVASTATIN 10 MG TABLET: 10 | 30 days supply | Qty: 30 | Fill #0

## 2016-09-16 MED FILL — SYNTHROID 75 MCG TABLET: 75 | 30 days supply | Qty: 30 | Fill #5

## 2016-09-16 MED FILL — HYDROCHLOROTHIAZIDE 25 MG T: 25 | 30 days supply | Qty: 30 | Fill #3

## 2016-09-23 DIAGNOSIS — M545 Low back pain: Secondary | ICD-10-CM | POA: Diagnosis not present

## 2016-09-23 MED FILL — HYDROCODON-APAP 10-325: 10-325 | 30 days supply | Qty: 120 | Fill #0

## 2016-09-23 MED FILL — ALPRAZolam 1 MG TABS: 1 | 30 days supply | Qty: 90 | Fill #2

## 2016-10-03 MED FILL — ATORVASTATIN 10 MG TABLET: 10 | 30 days supply | Qty: 30 | Fill #1

## 2016-10-17 MED FILL — HYDROCHLOROTHIAZIDE 25 MG T: 25 | 30 days supply | Qty: 30 | Fill #4

## 2016-10-17 MED FILL — SYNTHROID 75 MCG TABLET: 75 | 30 days supply | Qty: 30 | Fill #6

## 2016-10-21 DIAGNOSIS — R52 Pain, unspecified: Secondary | ICD-10-CM | POA: Diagnosis not present

## 2016-10-21 DIAGNOSIS — M0210 Postdysenteric arthropathy, unspecified site: Secondary | ICD-10-CM | POA: Diagnosis not present

## 2016-10-21 DIAGNOSIS — M545 Low back pain: Secondary | ICD-10-CM | POA: Diagnosis not present

## 2016-10-21 MED FILL — ALPRAZolam 1 MG TABS: 1 | 30 days supply | Qty: 90 | Fill #3

## 2016-10-21 MED FILL — HYDROCODON-APAP 10-325: 10-325 | 30 days supply | Qty: 120 | Fill #0

## 2016-11-04 MED FILL — ATORVASTATIN 10 MG TABLET: 10 | 30 days supply | Qty: 30 | Fill #2

## 2016-11-04 MED FILL — CYCLOBENZAPRINE 10 MG TAB: 10 | 30 days supply | Qty: 60 | Fill #4

## 2016-11-17 MED FILL — SYNTHROID 75 MCG TABLET: 75 | 30 days supply | Qty: 30 | Fill #7

## 2016-11-17 MED FILL — HYDROCHLOROTHIAZIDE 25 MG T: 25 | 30 days supply | Qty: 30 | Fill #5

## 2016-11-21 MED FILL — ALPRAZolam 1 MG TABS: 1 | 30 days supply | Qty: 90 | Fill #4

## 2016-11-26 DIAGNOSIS — M545 Low back pain: Secondary | ICD-10-CM | POA: Diagnosis not present

## 2016-11-26 DIAGNOSIS — M0210 Postdysenteric arthropathy, unspecified site: Secondary | ICD-10-CM | POA: Diagnosis not present

## 2016-11-26 DIAGNOSIS — R52 Pain, unspecified: Secondary | ICD-10-CM | POA: Diagnosis not present

## 2016-11-26 MED FILL — HYDROCODON-APAP 10-325: 10-325 | 30 days supply | Qty: 120 | Fill #0

## 2016-12-04 MED FILL — ATORVASTATIN 10 MG TABLET: 10 | 30 days supply | Qty: 30 | Fill #3

## 2016-12-15 MED FILL — SYNTHROID 75 MCG TABLET: 75 | 30 days supply | Qty: 30 | Fill #8

## 2016-12-17 MED FILL — TRIAMTERENE-HCTZ 37.5-25 MG: 37.5-25 | 30 days supply | Qty: 30 | Fill #0

## 2016-12-25 DIAGNOSIS — M545 Low back pain: Secondary | ICD-10-CM | POA: Diagnosis not present

## 2016-12-25 DIAGNOSIS — Z79891 Long term (current) use of opiate analgesic: Secondary | ICD-10-CM | POA: Diagnosis not present

## 2016-12-25 MED FILL — NAPROXEN 500 MG TABLET: 500 | 30 days supply | Qty: 60 | Fill #4 | Status: TO

## 2016-12-25 MED FILL — ALPRAZolam 1 MG TABS: 1 | 30 days supply | Qty: 90 | Fill #5

## 2016-12-25 MED FILL — HYDROCODON-APAP 10-325: 10-325 | 30 days supply | Qty: 120 | Fill #0

## 2017-01-02 MED FILL — ATORVASTATIN 10 MG TABLET: 10 | 30 days supply | Qty: 30 | Fill #4

## 2017-01-15 MED FILL — SYNTHROID 75 MCG TABLET: 75 | 30 days supply | Qty: 30 | Fill #9

## 2017-01-15 MED FILL — TRIAMTERENE-HCTZ 37.5-25 MG: 37.5-25 | 30 days supply | Qty: 30 | Fill #1

## 2017-01-22 DIAGNOSIS — R52 Pain, unspecified: Secondary | ICD-10-CM | POA: Diagnosis not present

## 2017-01-22 DIAGNOSIS — M545 Low back pain: Secondary | ICD-10-CM | POA: Diagnosis not present

## 2017-01-22 DIAGNOSIS — M0210 Postdysenteric arthropathy, unspecified site: Secondary | ICD-10-CM | POA: Diagnosis not present

## 2017-01-22 MED FILL — HYDROCODON-APAP 10-325: 10-325 | 30 days supply | Qty: 120 | Fill #0

## 2017-01-22 MED FILL — ALPRAZolam 1 MG TABS: 1 | 30 days supply | Qty: 90 | Fill #0

## 2017-01-22 MED FILL — CYCLOBENZAPRINE 10 MG TAB: 10 | 30 days supply | Qty: 60 | Fill #0

## 2017-02-02 MED FILL — ATORVASTATIN 10 MG TABLET: 10 | 30 days supply | Qty: 30 | Fill #5

## 2017-02-16 MED FILL — TRIAMTERENE-HCTZ 37.5-25 MG: 37.5-25 | 30 days supply | Qty: 30 | Fill #2

## 2017-02-16 MED FILL — SYNTHROID 75 MCG TABLET: 75 | 30 days supply | Qty: 30 | Fill #10

## 2017-02-24 MED FILL — ALPRAZolam 1 MG TABS: 1 | 30 days supply | Qty: 90 | Fill #1

## 2017-02-24 MED FILL — HYDROCODON-APAP 10-325: 10-325 | 30 days supply | Qty: 120 | Fill #0

## 2017-03-04 MED FILL — ATORVASTATIN 10 MG TABLET: 10 | 30 days supply | Qty: 30 | Fill #0

## 2017-03-19 MED FILL — SYNTHROID 75 MCG TABLET: 75 | 30 days supply | Qty: 30 | Fill #11

## 2017-03-19 MED FILL — TRIAMTERENE-HCTZ 37.5-25 MG: 37.5-25 | 30 days supply | Qty: 30 | Fill #3

## 2017-03-24 MED FILL — ALPRAZolam 1 MG TABS: 1 | 30 days supply | Qty: 90 | Fill #2

## 2017-03-26 DIAGNOSIS — M0210 Postdysenteric arthropathy, unspecified site: Secondary | ICD-10-CM | POA: Diagnosis not present

## 2017-03-26 DIAGNOSIS — M545 Low back pain: Secondary | ICD-10-CM | POA: Diagnosis not present

## 2017-03-26 DIAGNOSIS — R52 Pain, unspecified: Secondary | ICD-10-CM | POA: Diagnosis not present

## 2017-03-26 MED FILL — HYDROCODON-APAP 10-325: 10-325 | 30 days supply | Qty: 120 | Fill #0

## 2017-04-02 MED FILL — ATORVASTATIN 10 MG TABLET: 10 | 30 days supply | Qty: 30 | Fill #1

## 2017-04-17 MED FILL — TRIAMTERENE-HCTZ 37.5-25 MG: 37.5-25 | 30 days supply | Qty: 30 | Fill #4

## 2017-04-20 MED FILL — SYNTHROID 75 MCG TABLET: 75 | 30 days supply | Qty: 30 | Fill #0 | Status: TO

## 2017-04-27 DIAGNOSIS — Z79891 Long term (current) use of opiate analgesic: Secondary | ICD-10-CM | POA: Diagnosis not present

## 2017-04-27 MED FILL — HYDROCODON-APAP 10-325: 10-325 | 30 days supply | Qty: 120 | Fill #0

## 2017-04-27 MED FILL — ALPRAZolam 1 MG TABS: 1 | 30 days supply | Qty: 90 | Fill #3

## 2017-05-01 MED FILL — ATORVASTATIN 10 MG TABLET: 10 | 30 days supply | Qty: 30 | Fill #2

## 2017-05-20 MED FILL — SYNTHROID 75 MCG TABLET: 75 | 30 days supply | Qty: 30 | Fill #0

## 2017-05-20 MED FILL — TRIAMTERENE-HCTZ 37.5-25 MG: 37.5-25 | 30 days supply | Qty: 30 | Fill #0

## 2017-05-20 MED FILL — NAPROXEN 500 MG TABLET: 500 | 30 days supply | Qty: 60 | Fill #0

## 2017-05-27 MED FILL — ALPRAZolam 1 MG TABS: 1 | 30 days supply | Qty: 90 | Fill #4

## 2017-05-28 DIAGNOSIS — Z79891 Long term (current) use of opiate analgesic: Secondary | ICD-10-CM | POA: Diagnosis not present

## 2017-05-28 MED FILL — ATORVASTATIN 10 MG TABLET: 10 | 14 days supply | Qty: 14 | Fill #3

## 2017-06-11 MED FILL — ATORVASTATIN 10 MG TABLET: 10 | 30 days supply | Qty: 30 | Fill #4

## 2017-06-18 MED FILL — TRIAMTERENE-HCTZ 37.5-25 MG: 37.5-25 | 25 days supply | Qty: 25 | Fill #1

## 2017-06-18 MED FILL — SYNTHROID 75 MCG TABLET: 75 | 25 days supply | Qty: 25 | Fill #1

## 2017-06-23 ENCOUNTER — Other Ambulatory Visit: Payer: Self-pay | Admitting: Family Medicine

## 2017-06-23 DIAGNOSIS — Z1231 Encounter for screening mammogram for malignant neoplasm of breast: Secondary | ICD-10-CM

## 2017-06-25 ENCOUNTER — Ambulatory Visit
Admission: RE | Admit: 2017-06-25 | Discharge: 2017-06-25 | Disposition: A | Discharge status: Home / Self Care | Payer: PPO | Source: Ambulatory Visit | Attending: Family Medicine | Admitting: Family Medicine

## 2017-06-25 DIAGNOSIS — Z1231 Encounter for screening mammogram for malignant neoplasm of breast: Secondary | ICD-10-CM | POA: Diagnosis not present

## 2017-06-25 DIAGNOSIS — Z79891 Long term (current) use of opiate analgesic: Secondary | ICD-10-CM | POA: Diagnosis not present

## 2017-06-26 MED FILL — NAPROXEN 500 MG TABLET: 500 | 30 days supply | Qty: 60 | Fill #1

## 2017-06-26 MED FILL — ALPRAZolam 1 MG TABS: 1 | 30 days supply | Qty: 90 | Fill #5

## 2017-07-09 MED FILL — HYDROCODON-APAP 10-325: 10-325 | 30 days supply | Qty: 120 | Fill #0

## 2017-07-13 MED FILL — TRIAMTERENE/HCTZ 37.5/25 TB: 37.5-25 | 30 days supply | Qty: 30 | Fill #2

## 2017-07-13 MED FILL — SYNTHROID 75 MCG TABLET: 75 | 30 days supply | Qty: 30 | Fill #2

## 2017-07-13 MED FILL — ATORVASTATIN 10 MG TABLET: 10 | 30 days supply | Qty: 30 | Fill #5

## 2017-08-07 DIAGNOSIS — M0210 Postdysenteric arthropathy, unspecified site: Secondary | ICD-10-CM | POA: Diagnosis not present

## 2017-08-07 DIAGNOSIS — M545 Low back pain: Secondary | ICD-10-CM | POA: Diagnosis not present

## 2017-08-07 DIAGNOSIS — R52 Pain, unspecified: Secondary | ICD-10-CM | POA: Diagnosis not present

## 2017-08-07 MED FILL — HYDROCODON-APAP 10-325: 10-325 | 30 days supply | Qty: 120 | Fill #0

## 2017-08-07 MED FILL — ALPRAZolam 1 MG TABS: 1 | 30 days supply | Qty: 90 | Fill #0

## 2017-08-12 MED FILL — SYNTHROID 75 MCG TABLET: 75 | 30 days supply | Qty: 30 | Fill #3

## 2017-08-12 MED FILL — TRIAMTERENE/HCTZ 37.5/25 TB: 37.5-25 | 30 days supply | Qty: 30 | Fill #3

## 2017-08-12 MED FILL — ATORVASTATIN 10 MG TABLET: 10 | 30 days supply | Qty: 30 | Fill #0

## 2017-09-01 DIAGNOSIS — M545 Low back pain: Secondary | ICD-10-CM | POA: Diagnosis not present

## 2017-09-01 DIAGNOSIS — R52 Pain, unspecified: Secondary | ICD-10-CM | POA: Diagnosis not present

## 2017-09-01 DIAGNOSIS — M0210 Postdysenteric arthropathy, unspecified site: Secondary | ICD-10-CM | POA: Diagnosis not present

## 2017-09-01 MED FILL — valACYclovir HCL 1 GM TABS: 1 | 7 days supply | Qty: 14 | Fill #0

## 2017-09-01 MED FILL — NYSTATIN-TRIAMCINOLONE OINT: 100000-0.1 | 7 days supply | Qty: 15 | Fill #0

## 2017-09-07 MED FILL — NAPROXEN 500 MG TABLET: 500 | 30 days supply | Qty: 60 | Fill #2

## 2017-09-07 MED FILL — ALPRAZolam 1 MG TABS: 1 | 30 days supply | Qty: 90 | Fill #1

## 2017-09-07 MED FILL — HYDROCODON-APAP 10-325: 10-325 | 30 days supply | Qty: 120 | Fill #0

## 2017-09-10 MED FILL — ATORVASTATIN 10 MG TABLET: 10 | 30 days supply | Qty: 30 | Fill #1

## 2017-09-10 MED FILL — TRIAMTERENE/HCTZ 37.5/25 TB: 37.5-25 | 30 days supply | Qty: 30 | Fill #4

## 2017-09-10 MED FILL — SYNTHROID 75 MCG TABLET: 75 | 30 days supply | Qty: 30 | Fill #4

## 2017-10-07 DIAGNOSIS — M0210 Postdysenteric arthropathy, unspecified site: Secondary | ICD-10-CM | POA: Diagnosis not present

## 2017-10-07 DIAGNOSIS — M545 Low back pain: Secondary | ICD-10-CM | POA: Diagnosis not present

## 2017-10-07 DIAGNOSIS — R52 Pain, unspecified: Secondary | ICD-10-CM | POA: Diagnosis not present

## 2017-10-07 DIAGNOSIS — Z79891 Long term (current) use of opiate analgesic: Secondary | ICD-10-CM | POA: Diagnosis not present

## 2017-10-07 MED FILL — ALPRAZolam 1 MG TABS: 1 | 30 days supply | Qty: 90 | Fill #2

## 2017-10-07 MED FILL — HYDROCODON-APAP 10-325: 10-325 | 30 days supply | Qty: 120 | Fill #0

## 2017-10-09 MED FILL — SYNTHROID 75 MCG TABLET: 75 | 30 days supply | Qty: 30 | Fill #5

## 2017-10-09 MED FILL — ATORVASTATIN 10 MG TABLET: 10 | 30 days supply | Qty: 30 | Fill #2

## 2017-10-09 MED FILL — TRIAMTERENE/HCTZ 37.5/25 TB: 37.5-25 | 30 days supply | Qty: 30 | Fill #5

## 2017-11-10 DIAGNOSIS — M0210 Postdysenteric arthropathy, unspecified site: Secondary | ICD-10-CM | POA: Diagnosis not present

## 2017-11-10 DIAGNOSIS — R52 Pain, unspecified: Secondary | ICD-10-CM | POA: Diagnosis not present

## 2017-11-10 DIAGNOSIS — M545 Low back pain: Secondary | ICD-10-CM | POA: Diagnosis not present

## 2017-11-10 MED FILL — ALPRAZolam 1 MG TABS: 1 | 30 days supply | Qty: 90 | Fill #3

## 2017-11-10 MED FILL — ATORVASTATIN 10 MG TABLET: 10 | 30 days supply | Qty: 30 | Fill #3

## 2017-11-10 MED FILL — TRIAMTERENE/HCTZ 37.5/25 TB: 37.5-25 | 30 days supply | Qty: 30 | Fill #6

## 2017-11-10 MED FILL — SYNTHROID 75 MCG TABLET: 75 | 30 days supply | Qty: 30 | Fill #6

## 2017-11-10 MED FILL — HYDROCODON-APAP 10-325: 10-325 | 30 days supply | Qty: 120 | Fill #0

## 2017-12-10 MED FILL — TRIAMTERENE/HCTZ 37.5/25 TB: 37.5-25 | 30 days supply | Qty: 30 | Fill #7

## 2017-12-10 MED FILL — SYNTHROID 75 MCG TABLET: 75 | 30 days supply | Qty: 30 | Fill #7

## 2017-12-10 MED FILL — NAPROXEN 500 MG TABLET: 500 | 30 days supply | Qty: 60 | Fill #3

## 2017-12-10 MED FILL — ATORVASTATIN 10 MG TABLET: 10 | 30 days supply | Qty: 30 | Fill #4

## 2017-12-14 DIAGNOSIS — R52 Pain, unspecified: Secondary | ICD-10-CM | POA: Diagnosis not present

## 2017-12-14 DIAGNOSIS — M0210 Postdysenteric arthropathy, unspecified site: Secondary | ICD-10-CM | POA: Diagnosis not present

## 2017-12-14 DIAGNOSIS — M545 Low back pain: Secondary | ICD-10-CM | POA: Diagnosis not present

## 2017-12-14 MED FILL — HYDROCODON-APAP 10-325: 10-325 | 30 days supply | Qty: 120 | Fill #0

## 2017-12-14 MED FILL — ALPRAZolam 1 MG TABS: 1 | 30 days supply | Qty: 90 | Fill #4

## 2018-01-05 DIAGNOSIS — Z79899 Other long term (current) drug therapy: Secondary | ICD-10-CM | POA: Diagnosis not present

## 2018-01-05 DIAGNOSIS — I10 Essential (primary) hypertension: Secondary | ICD-10-CM | POA: Diagnosis not present

## 2018-01-05 DIAGNOSIS — E039 Hypothyroidism, unspecified: Secondary | ICD-10-CM | POA: Diagnosis not present

## 2018-01-05 DIAGNOSIS — Z0000 Encounter for general adult medical examination without abnormal findings: Secondary | ICD-10-CM | POA: Diagnosis not present

## 2018-01-05 DIAGNOSIS — M129 Arthropathy, unspecified: Secondary | ICD-10-CM | POA: Diagnosis not present

## 2018-01-05 DIAGNOSIS — G8929 Other chronic pain: Secondary | ICD-10-CM | POA: Diagnosis not present

## 2018-01-05 DIAGNOSIS — M5441 Lumbago with sciatica, right side: Secondary | ICD-10-CM | POA: Diagnosis not present

## 2018-01-05 DIAGNOSIS — E7800 Pure hypercholesterolemia, unspecified: Secondary | ICD-10-CM | POA: Diagnosis not present

## 2018-01-07 MED FILL — SYNTHROID 75 MCG TABLET: 75 | 30 days supply | Qty: 30 | Fill #8

## 2018-01-07 MED FILL — ATORVASTATIN 10 MG TABLET: 10 | 30 days supply | Qty: 30 | Fill #5

## 2018-01-07 MED FILL — TRIAMTERENE-HCTZ 37.5-25 MG: 37.5-25 | 30 days supply | Qty: 30 | Fill #8

## 2018-01-21 DIAGNOSIS — M5441 Lumbago with sciatica, right side: Secondary | ICD-10-CM | POA: Diagnosis not present

## 2018-01-21 DIAGNOSIS — F411 Generalized anxiety disorder: Secondary | ICD-10-CM | POA: Diagnosis not present

## 2018-01-21 DIAGNOSIS — Z79899 Other long term (current) drug therapy: Secondary | ICD-10-CM | POA: Diagnosis not present

## 2018-01-21 DIAGNOSIS — G8929 Other chronic pain: Secondary | ICD-10-CM | POA: Diagnosis not present

## 2018-01-21 DIAGNOSIS — E039 Hypothyroidism, unspecified: Secondary | ICD-10-CM | POA: Diagnosis not present

## 2018-02-08 DIAGNOSIS — E7800 Pure hypercholesterolemia, unspecified: Secondary | ICD-10-CM | POA: Diagnosis not present

## 2018-02-08 DIAGNOSIS — I10 Essential (primary) hypertension: Secondary | ICD-10-CM | POA: Diagnosis not present

## 2018-02-08 DIAGNOSIS — F411 Generalized anxiety disorder: Secondary | ICD-10-CM | POA: Diagnosis not present

## 2018-02-08 DIAGNOSIS — E039 Hypothyroidism, unspecified: Secondary | ICD-10-CM | POA: Diagnosis not present

## 2018-02-22 DIAGNOSIS — M5441 Lumbago with sciatica, right side: Secondary | ICD-10-CM | POA: Diagnosis not present

## 2018-02-22 DIAGNOSIS — G8929 Other chronic pain: Secondary | ICD-10-CM | POA: Diagnosis not present

## 2018-02-22 DIAGNOSIS — Z79899 Other long term (current) drug therapy: Secondary | ICD-10-CM | POA: Diagnosis not present

## 2018-02-22 DIAGNOSIS — E039 Hypothyroidism, unspecified: Secondary | ICD-10-CM | POA: Diagnosis not present

## 2018-02-22 DIAGNOSIS — F411 Generalized anxiety disorder: Secondary | ICD-10-CM | POA: Diagnosis not present

## 2018-03-23 DIAGNOSIS — M5441 Lumbago with sciatica, right side: Secondary | ICD-10-CM | POA: Diagnosis not present

## 2018-03-23 DIAGNOSIS — E7800 Pure hypercholesterolemia, unspecified: Secondary | ICD-10-CM | POA: Diagnosis not present

## 2018-03-23 DIAGNOSIS — Z79899 Other long term (current) drug therapy: Secondary | ICD-10-CM | POA: Diagnosis not present

## 2018-03-23 DIAGNOSIS — G8929 Other chronic pain: Secondary | ICD-10-CM | POA: Diagnosis not present

## 2018-03-23 DIAGNOSIS — F411 Generalized anxiety disorder: Secondary | ICD-10-CM | POA: Diagnosis not present

## 2018-05-24 DIAGNOSIS — I10 Essential (primary) hypertension: Secondary | ICD-10-CM | POA: Diagnosis not present

## 2018-05-24 DIAGNOSIS — N939 Abnormal uterine and vaginal bleeding, unspecified: Secondary | ICD-10-CM | POA: Diagnosis not present

## 2018-05-24 DIAGNOSIS — E039 Hypothyroidism, unspecified: Secondary | ICD-10-CM | POA: Diagnosis not present

## 2018-05-24 DIAGNOSIS — G8929 Other chronic pain: Secondary | ICD-10-CM | POA: Diagnosis not present

## 2018-05-24 DIAGNOSIS — Z79899 Other long term (current) drug therapy: Secondary | ICD-10-CM | POA: Diagnosis not present

## 2018-05-24 DIAGNOSIS — M5441 Lumbago with sciatica, right side: Secondary | ICD-10-CM | POA: Diagnosis not present

## 2018-05-24 DIAGNOSIS — E7800 Pure hypercholesterolemia, unspecified: Secondary | ICD-10-CM | POA: Diagnosis not present

## 2018-06-24 DIAGNOSIS — R0989 Other specified symptoms and signs involving the circulatory and respiratory systems: Secondary | ICD-10-CM | POA: Diagnosis not present

## 2018-06-24 DIAGNOSIS — M5441 Lumbago with sciatica, right side: Secondary | ICD-10-CM | POA: Diagnosis not present

## 2018-06-24 DIAGNOSIS — M79604 Pain in right leg: Secondary | ICD-10-CM | POA: Diagnosis not present

## 2018-06-24 DIAGNOSIS — M79605 Pain in left leg: Secondary | ICD-10-CM | POA: Diagnosis not present

## 2018-06-24 DIAGNOSIS — G8929 Other chronic pain: Secondary | ICD-10-CM | POA: Diagnosis not present

## 2018-06-24 DIAGNOSIS — I10 Essential (primary) hypertension: Secondary | ICD-10-CM | POA: Diagnosis not present

## 2018-06-24 DIAGNOSIS — Z79899 Other long term (current) drug therapy: Secondary | ICD-10-CM | POA: Diagnosis not present

## 2018-06-24 DIAGNOSIS — F411 Generalized anxiety disorder: Secondary | ICD-10-CM | POA: Diagnosis not present

## 2018-06-24 DIAGNOSIS — N184 Chronic kidney disease, stage 4 (severe): Secondary | ICD-10-CM | POA: Diagnosis not present

## 2018-07-27 DIAGNOSIS — G8929 Other chronic pain: Secondary | ICD-10-CM | POA: Diagnosis not present

## 2018-07-27 DIAGNOSIS — Z23 Encounter for immunization: Secondary | ICD-10-CM | POA: Diagnosis not present

## 2018-07-27 DIAGNOSIS — M5441 Lumbago with sciatica, right side: Secondary | ICD-10-CM | POA: Diagnosis not present

## 2018-07-27 DIAGNOSIS — F411 Generalized anxiety disorder: Secondary | ICD-10-CM | POA: Diagnosis not present

## 2018-07-27 DIAGNOSIS — I10 Essential (primary) hypertension: Secondary | ICD-10-CM | POA: Diagnosis not present

## 2018-07-27 DIAGNOSIS — Z79899 Other long term (current) drug therapy: Secondary | ICD-10-CM | POA: Diagnosis not present

## 2018-08-20 ENCOUNTER — Other Ambulatory Visit: Payer: Self-pay | Admitting: Internal Medicine

## 2018-08-20 ENCOUNTER — Ambulatory Visit
Admission: RE | Admit: 2018-08-20 | Discharge: 2018-08-20 | Disposition: A | Discharge status: Home / Self Care | Payer: Medicare HMO | Source: Ambulatory Visit | Attending: Internal Medicine | Admitting: Internal Medicine

## 2018-08-20 DIAGNOSIS — Z1231 Encounter for screening mammogram for malignant neoplasm of breast: Secondary | ICD-10-CM

## 2018-08-24 DIAGNOSIS — F411 Generalized anxiety disorder: Secondary | ICD-10-CM | POA: Diagnosis not present

## 2018-08-24 DIAGNOSIS — E7800 Pure hypercholesterolemia, unspecified: Secondary | ICD-10-CM | POA: Diagnosis not present

## 2018-08-24 DIAGNOSIS — E039 Hypothyroidism, unspecified: Secondary | ICD-10-CM | POA: Diagnosis not present

## 2018-08-24 DIAGNOSIS — Z79899 Other long term (current) drug therapy: Secondary | ICD-10-CM | POA: Diagnosis not present

## 2018-08-24 DIAGNOSIS — M5441 Lumbago with sciatica, right side: Secondary | ICD-10-CM | POA: Diagnosis not present

## 2018-08-24 DIAGNOSIS — G8929 Other chronic pain: Secondary | ICD-10-CM | POA: Diagnosis not present

## 2018-09-23 DIAGNOSIS — M5441 Lumbago with sciatica, right side: Secondary | ICD-10-CM | POA: Diagnosis not present

## 2018-09-23 DIAGNOSIS — G8929 Other chronic pain: Secondary | ICD-10-CM | POA: Diagnosis not present

## 2018-09-23 DIAGNOSIS — Z79899 Other long term (current) drug therapy: Secondary | ICD-10-CM | POA: Diagnosis not present

## 2018-09-23 DIAGNOSIS — F411 Generalized anxiety disorder: Secondary | ICD-10-CM | POA: Diagnosis not present

## 2018-09-23 DIAGNOSIS — I10 Essential (primary) hypertension: Secondary | ICD-10-CM | POA: Diagnosis not present

## 2018-10-22 DIAGNOSIS — G8929 Other chronic pain: Secondary | ICD-10-CM | POA: Diagnosis not present

## 2018-10-22 DIAGNOSIS — E7800 Pure hypercholesterolemia, unspecified: Secondary | ICD-10-CM | POA: Diagnosis not present

## 2018-10-22 DIAGNOSIS — M5441 Lumbago with sciatica, right side: Secondary | ICD-10-CM | POA: Diagnosis not present

## 2018-10-22 DIAGNOSIS — Z79899 Other long term (current) drug therapy: Secondary | ICD-10-CM | POA: Diagnosis not present

## 2018-11-23 DIAGNOSIS — F411 Generalized anxiety disorder: Secondary | ICD-10-CM | POA: Diagnosis not present

## 2018-11-23 DIAGNOSIS — G8929 Other chronic pain: Secondary | ICD-10-CM | POA: Diagnosis not present

## 2018-11-23 DIAGNOSIS — M5441 Lumbago with sciatica, right side: Secondary | ICD-10-CM | POA: Diagnosis not present

## 2018-11-23 DIAGNOSIS — I10 Essential (primary) hypertension: Secondary | ICD-10-CM | POA: Diagnosis not present

## 2018-11-23 DIAGNOSIS — Z79899 Other long term (current) drug therapy: Secondary | ICD-10-CM | POA: Diagnosis not present

## 2018-12-24 DIAGNOSIS — M5441 Lumbago with sciatica, right side: Secondary | ICD-10-CM | POA: Diagnosis not present

## 2018-12-24 DIAGNOSIS — E7800 Pure hypercholesterolemia, unspecified: Secondary | ICD-10-CM | POA: Diagnosis not present

## 2018-12-24 DIAGNOSIS — Z79899 Other long term (current) drug therapy: Secondary | ICD-10-CM | POA: Diagnosis not present

## 2018-12-24 DIAGNOSIS — F411 Generalized anxiety disorder: Secondary | ICD-10-CM | POA: Diagnosis not present

## 2018-12-24 DIAGNOSIS — G8929 Other chronic pain: Secondary | ICD-10-CM | POA: Diagnosis not present

## 2018-12-24 DIAGNOSIS — E039 Hypothyroidism, unspecified: Secondary | ICD-10-CM | POA: Diagnosis not present

## 2019-01-21 DIAGNOSIS — F411 Generalized anxiety disorder: Secondary | ICD-10-CM | POA: Diagnosis not present

## 2019-01-21 DIAGNOSIS — Z79899 Other long term (current) drug therapy: Secondary | ICD-10-CM | POA: Diagnosis not present

## 2019-01-21 DIAGNOSIS — G8929 Other chronic pain: Secondary | ICD-10-CM | POA: Diagnosis not present

## 2019-01-21 DIAGNOSIS — M5441 Lumbago with sciatica, right side: Secondary | ICD-10-CM | POA: Diagnosis not present

## 2019-02-22 DIAGNOSIS — M5430 Sciatica, unspecified side: Secondary | ICD-10-CM | POA: Diagnosis not present

## 2019-02-22 DIAGNOSIS — Z79899 Other long term (current) drug therapy: Secondary | ICD-10-CM | POA: Diagnosis not present

## 2019-02-22 DIAGNOSIS — G8929 Other chronic pain: Secondary | ICD-10-CM | POA: Diagnosis not present

## 2019-02-22 DIAGNOSIS — M5441 Lumbago with sciatica, right side: Secondary | ICD-10-CM | POA: Diagnosis not present

## 2019-02-22 DIAGNOSIS — I10 Essential (primary) hypertension: Secondary | ICD-10-CM | POA: Diagnosis not present

## 2019-09-08 ENCOUNTER — Other Ambulatory Visit: Payer: Self-pay | Admitting: Internal Medicine

## 2019-09-08 DIAGNOSIS — Z1231 Encounter for screening mammogram for malignant neoplasm of breast: Secondary | ICD-10-CM

## 2019-09-09 ENCOUNTER — Ambulatory Visit: Payer: Medicare HMO

## 2019-09-09 ENCOUNTER — Other Ambulatory Visit: Payer: Self-pay

## 2019-09-09 ENCOUNTER — Ambulatory Visit
Admission: RE | Admit: 2019-09-09 | Discharge: 2019-09-09 | Disposition: A | Discharge status: Home / Self Care | Payer: Medicare HMO | Source: Ambulatory Visit | Attending: Internal Medicine | Admitting: Internal Medicine

## 2019-09-09 DIAGNOSIS — Z1231 Encounter for screening mammogram for malignant neoplasm of breast: Secondary | ICD-10-CM

## 2020-03-29 ENCOUNTER — Emergency Department (HOSPITAL_COMMUNITY): Payer: Medicare HMO

## 2020-03-29 ENCOUNTER — Emergency Department (HOSPITAL_COMMUNITY)
Admission: EM | Admit: 2020-03-29 | Discharge: 2020-03-29 | Discharge status: Against Medical Advice | Payer: Medicare HMO | Attending: Emergency Medicine | Admitting: Emergency Medicine

## 2020-03-29 ENCOUNTER — Other Ambulatory Visit: Payer: Self-pay

## 2020-03-29 ENCOUNTER — Encounter (HOSPITAL_COMMUNITY): Payer: Self-pay

## 2020-03-29 DIAGNOSIS — Z041 Encounter for examination and observation following transport accident: Secondary | ICD-10-CM | POA: Diagnosis present

## 2020-03-29 DIAGNOSIS — Z5321 Procedure and treatment not carried out due to patient leaving prior to being seen by health care provider: Secondary | ICD-10-CM | POA: Insufficient documentation

## 2020-03-29 NOTE — ED Notes (Signed)
Pt LWBS 
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
## 2020-04-03 ENCOUNTER — Emergency Department (HOSPITAL_COMMUNITY): Payer: Medicare HMO

## 2020-04-03 ENCOUNTER — Encounter (HOSPITAL_COMMUNITY): Payer: Self-pay | Admitting: Emergency Medicine

## 2020-04-03 ENCOUNTER — Emergency Department (HOSPITAL_COMMUNITY)
Admission: EM | Admit: 2020-04-03 | Discharge: 2020-04-03 | Disposition: A | Discharge status: Home / Self Care | Payer: Medicare HMO | Attending: Emergency Medicine | Admitting: Emergency Medicine

## 2020-04-03 ENCOUNTER — Other Ambulatory Visit: Payer: Self-pay

## 2020-04-03 DIAGNOSIS — R911 Solitary pulmonary nodule: Secondary | ICD-10-CM | POA: Diagnosis not present

## 2020-04-03 DIAGNOSIS — Z87891 Personal history of nicotine dependence: Secondary | ICD-10-CM | POA: Insufficient documentation

## 2020-04-03 DIAGNOSIS — E039 Hypothyroidism, unspecified: Secondary | ICD-10-CM | POA: Insufficient documentation

## 2020-04-03 DIAGNOSIS — Y999 Unspecified external cause status: Secondary | ICD-10-CM | POA: Insufficient documentation

## 2020-04-03 DIAGNOSIS — S2222XA Fracture of body of sternum, initial encounter for closed fracture: Secondary | ICD-10-CM | POA: Insufficient documentation

## 2020-04-03 DIAGNOSIS — M25512 Pain in left shoulder: Secondary | ICD-10-CM | POA: Insufficient documentation

## 2020-04-03 DIAGNOSIS — F1290 Cannabis use, unspecified, uncomplicated: Secondary | ICD-10-CM | POA: Insufficient documentation

## 2020-04-03 DIAGNOSIS — R079 Chest pain, unspecified: Secondary | ICD-10-CM

## 2020-04-03 DIAGNOSIS — Y929 Unspecified place or not applicable: Secondary | ICD-10-CM | POA: Insufficient documentation

## 2020-04-03 DIAGNOSIS — I10 Essential (primary) hypertension: Secondary | ICD-10-CM | POA: Insufficient documentation

## 2020-04-03 DIAGNOSIS — Z79899 Other long term (current) drug therapy: Secondary | ICD-10-CM | POA: Insufficient documentation

## 2020-04-03 DIAGNOSIS — R0789 Other chest pain: Secondary | ICD-10-CM | POA: Diagnosis present

## 2020-04-03 DIAGNOSIS — Y939 Activity, unspecified: Secondary | ICD-10-CM | POA: Diagnosis not present

## 2020-04-03 LAB — CBC
HCT: 41.9 % (ref 36.0–46.0)
Hemoglobin: 13.5 g/dL (ref 12.0–15.0)
MCH: 27.2 pg (ref 26.0–34.0)
MCHC: 32.2 g/dL (ref 30.0–36.0)
MCV: 84.5 fL (ref 80.0–100.0)
Platelets: 295 10*3/uL (ref 150–400)
RBC: 4.96 MIL/uL (ref 3.87–5.11)
RDW: 16.6 % — ABNORMAL HIGH (ref 11.5–15.5)
WBC: 8.5 10*3/uL (ref 4.0–10.5)
nRBC: 0 % (ref 0.0–0.2)

## 2020-04-03 LAB — BASIC METABOLIC PANEL
Anion gap: 12 (ref 5–15)
BUN: 14 mg/dL (ref 8–23)
CO2: 22 mmol/L (ref 22–32)
Calcium: 9.5 mg/dL (ref 8.9–10.3)
Chloride: 105 mmol/L (ref 98–111)
Creatinine, Ser: 0.89 mg/dL (ref 0.44–1.00)
GFR calc Af Amer: >60 mL/min (ref >60)
GFR calc non Af Amer: >60 mL/min (ref >60)
Glucose, Bld: 101 mg/dL — ABNORMAL HIGH (ref 70–99)
Potassium: 4.1 mmol/L (ref 3.5–5.1)
Sodium: 139 mmol/L (ref 135–145)

## 2020-04-03 LAB — TROPONIN I (HIGH SENSITIVITY): Troponin I (High Sensitivity): 4 ng/L (ref <18)

## 2020-04-03 MED ORDER — MELOXICAM 7.5 MG PO TABS: 7.5000 mg | ORAL_TABLET | Freq: Every day | ORAL | 0 refills | Status: AC | PRN

## 2020-04-03 MED ORDER — SODIUM CHLORIDE 0.9% FLUSH
3.0000 mL | Freq: Once | INTRAVENOUS | Status: AC
  Administered 2020-04-03: 3 mL via INTRAVENOUS

## 2020-04-03 MED ORDER — LIDOCAINE 5 % EX PTCH: 1.0000 | MEDICATED_PATCH | CUTANEOUS | 0 refills | Status: AC

## 2020-04-03 MED ORDER — METHOCARBAMOL 500 MG PO TABS: 500.0000 mg | ORAL_TABLET | Freq: Two times a day (BID) | ORAL | 0 refills | Status: AC | PRN

## 2020-04-03 MED ORDER — IOHEXOL 300 MG/ML  SOLN
75.0000 mL | Freq: Once | INTRAMUSCULAR | Status: AC | PRN
  Administered 2020-04-03: 75 mL via INTRAVENOUS

## 2020-04-03 MED ORDER — HYDROMORPHONE HCL 1 MG/ML IJ SOLN
1.0000 mg | Freq: Once | INTRAMUSCULAR | Status: AC
  Administered 2020-04-03: 1 mg via INTRAVENOUS
  Filled 2020-04-03: qty 1
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
## 2021-01-01 ENCOUNTER — Other Ambulatory Visit: Payer: Self-pay | Admitting: Internal Medicine

## 2021-01-01 DIAGNOSIS — Z1231 Encounter for screening mammogram for malignant neoplasm of breast: Secondary | ICD-10-CM

## 2021-01-02 ENCOUNTER — Ambulatory Visit
Admission: RE | Admit: 2021-01-02 | Discharge: 2021-01-02 | Disposition: A | Discharge status: Home / Self Care | Payer: Medicare Other | Source: Ambulatory Visit | Attending: Internal Medicine | Admitting: Internal Medicine

## 2021-01-02 ENCOUNTER — Other Ambulatory Visit: Payer: Self-pay

## 2021-01-02 DIAGNOSIS — Z1231 Encounter for screening mammogram for malignant neoplasm of breast: Secondary | ICD-10-CM

## 2021-12-17 ENCOUNTER — Other Ambulatory Visit: Payer: Self-pay

## 2021-12-17 ENCOUNTER — Ambulatory Visit
Admission: EM | Admit: 2021-12-17 | Discharge: 2021-12-17 | Disposition: A | Discharge status: Home / Self Care | Payer: Medicare Other

## 2022-01-07 ENCOUNTER — Other Ambulatory Visit: Payer: Self-pay | Admitting: Internal Medicine

## 2022-01-07 DIAGNOSIS — Z1231 Encounter for screening mammogram for malignant neoplasm of breast: Secondary | ICD-10-CM

## 2022-01-10 ENCOUNTER — Ambulatory Visit
Admission: RE | Admit: 2022-01-10 | Discharge: 2022-01-10 | Disposition: A | Discharge status: Home / Self Care | Payer: Medicare Other | Source: Ambulatory Visit | Attending: Internal Medicine | Admitting: Internal Medicine

## 2022-01-10 DIAGNOSIS — Z1231 Encounter for screening mammogram for malignant neoplasm of breast: Secondary | ICD-10-CM

## 2022-02-24 ENCOUNTER — Ambulatory Visit
Admission: EM | Admit: 2022-02-24 | Discharge: 2022-02-24 | Disposition: A | Discharge status: Home / Self Care | Payer: Medicare Other

## 2022-02-24 ENCOUNTER — Ambulatory Visit (INDEPENDENT_AMBULATORY_CARE_PROVIDER_SITE_OTHER): Payer: Medicare Other

## 2022-02-24 DIAGNOSIS — G8929 Other chronic pain: Secondary | ICD-10-CM

## 2022-02-24 DIAGNOSIS — M25551 Pain in right hip: Secondary | ICD-10-CM | POA: Diagnosis not present

## 2022-02-24 DIAGNOSIS — M1612 Unilateral primary osteoarthritis, left hip: Secondary | ICD-10-CM | POA: Diagnosis not present

## 2022-02-24 DIAGNOSIS — M25552 Pain in left hip: Secondary | ICD-10-CM

## 2022-02-24 DIAGNOSIS — R102 Pelvic and perineal pain: Secondary | ICD-10-CM

## 2022-02-24 MED ORDER — PREDNISONE 20 MG PO TABS: ORAL_TABLET | ORAL | 0 refills | Status: AC

## 2022-02-24 NOTE — ED Triage Notes (Signed)
2 days ago, Pt reports that she tried to walk up the stairs but couldn't due to left leg pain. Yesterday, Pt reports pain increased, noting that she feels like cannot bear weight on her foot. When walking she feels the pain mostly in her groin. ?Has been taking hydrocodone, muscle relaxers and hot compresses. No LLE swelling. No falls. ?

## 2022-02-24 NOTE — ED Provider Notes (Signed)
?[place] ? ? ?MRN: [id number] DOB: [birth year] ? ?Subjective:  ? ?Kimberly Blanchard is a 71 y.o. female presenting for 2-day history of acute onset moderate to severe left groin pain, left hip pain.  No fall, trauma, weakness, numbness or tingling.  Symptoms hurt significantly to the point that she is not able to bear weight or walk for long period of time.  She does have a history of arthritis to the area but it was mild in nature as confirmed from her x-ray done about 6 years ago.  No history of diabetes.  Past medical history is significant in her chart for chronic arthralgias of the knees and hips. ? ?No current facility-administered medications for this encounter. ? ?Current Outpatient Medications:  ?  allopurinol (ZYLOPRIM) 300 MG tablet, Take 300 mg by mouth daily., Disp: , Rfl:  ?  ALPRAZolam (XANAX) 1 MG tablet, Take 1 tablet (1 mg total) by mouth daily as needed for anxiety or sleep., Disp: 30 tablet, Rfl: 0 ?  atorvastatin (LIPITOR) 10 MG tablet, Take 1 tablet (10 mg total) by mouth at bedtime. Needs office visit, Disp: 30 tablet, Rfl: 2 ?  clonazePAM (KLONOPIN) 1 MG tablet, clonazepam 1 mg tablet  TAKE 1 TABLET BY MOUTH THREE TIMES DAILY, Disp: , Rfl:  ?  cyclobenzaprine (FLEXERIL) 10 MG tablet, TAKE 1 TABLET BY MOUTH 2 TIMES DAILY AS NEEDED FOR MUSCLE SPASMS., Disp: 60 tablet, Rfl: 0 ?  gabapentin (NEURONTIN) 300 MG capsule, TAKE 1 CAPSULE BY MOUTH IN THE MORNING AND 2 CAPSULES IN THE EVENING., Disp: 90 capsule, Rfl: 0 ?  HYDROcodone-acetaminophen (NORCO) 10-325 MG per tablet, Take 1 tablet by mouth every 6 (six) hours as needed for pain., Disp: , Rfl:  ?  levothyroxine (SYNTHROID, LEVOTHROID) 75 MCG tablet, Take 1 tablet (75 mcg total) by mouth daily. PT NEEDS OV FOR FUTURE REFILLS, Disp: 30 tablet, Rfl: 1 ?  lidocaine (LIDODERM) 5 %, Place 1 patch onto the skin daily. Place 1 patch to area of most significant pain once per day.  Remove & Discard patch within 12 hours, Disp: 30 patch,  Rfl: 0 ?  meloxicam (MOBIC) 7.5 MG tablet, Take 1 tablet (7.5 mg total) by mouth daily as needed for pain., Disp: 5 tablet, Rfl: 0 ?  methocarbamol (ROBAXIN) 500 MG tablet, Take 1 tablet (500 mg total) by mouth 2 (two) times daily as needed for muscle spasms., Disp: 10 tablet, Rfl: 0 ?  Multiple Vitamin (MULTIVITAMIN) capsule, Take 1 capsule by mouth daily., Disp: , Rfl:  ?  naproxen (NAPROSYN) 500 MG tablet, Take 1 tablet (500 mg total) by mouth 2 (two) times daily with a meal., Disp: 30 tablet, Rfl: 0 ?  ondansetron (ZOFRAN) 4 MG tablet, Take 1 tablet (4 mg total) by mouth every 8 (eight) hours as needed for nausea or vomiting., Disp: 5 tablet, Rfl: 0 ?  triamterene-hydrochlorothiazide (MAXZIDE-25) 37.5-25 MG tablet, TAKE 1 TABLET BY MOUTH DAILY., Disp: 30 tablet, Rfl: 2  ? ?No Known Allergies ? ?Past Medical History:  ?Diagnosis Date  ? Arthritis   ? Back pain   ? Ectopic pregnancy   ? Hypercholesterolemia   ? Hypertension   ? Thyroid disease   ?  ? ?Past Surgical History:  ?Procedure Laterality Date  ? BREAST BIOPSY Right   ? benign  ? ECTOPIC PREGNANCY SURGERY    ? TUBAL LIGATION    ? UNILATERAL SALPINGECTOMY    ? R/T ectopic pregnancy  ? ? ?Family History  ?Problem Relation  Age of Onset  ? Diabetes Mother   ? Hypertension Mother   ? Diabetes Sister   ? Hypertension Sister   ? Hypertension Maternal Grandmother   ? Colon cancer Neg Hx   ? ? ?Social History  ? ?Tobacco Use  ? Smoking status: Former  ? Smokeless tobacco: Never  ?Substance Use Topics  ? Alcohol use: No  ? Drug use: Yes  ?  Types: Marijuana  ?  Comment: uses Marijuana daily per pt.  ? ? ?ROS ? ? ?Objective:  ? ?Vitals: ?BP 137/70 (BP Location: Left Arm)   Pulse 81   Temp 98.1 ?F (36.7 ?C) (Oral)   Resp 17   SpO2 96%  ? ?Physical Exam ?Constitutional:   ?   General: She is not in acute distress. ?   Appearance: Normal appearance. She is well-developed. She is not ill-appearing, toxic-appearing or diaphoretic.  ?HENT:  ?   Head: Normocephalic and  atraumatic.  ?   Nose: Nose normal.  ?   Mouth/Throat:  ?   Mouth: Mucous membranes are moist.  ?Eyes:  ?   General: No scleral icterus.    ?   Right eye: No discharge.     ?   Left eye: No discharge.  ?   Extraocular Movements: Extraocular movements intact.  ?Cardiovascular:  ?   Rate and Rhythm: Normal rate.  ?Pulmonary:  ?   Effort: Pulmonary effort is normal.  ?Musculoskeletal:  ?   Left hip: Tenderness (over area outlined) and bony tenderness present. No deformity, lacerations or crepitus. Decreased range of motion (due to guarding).  ?     Legs: ? ?Skin: ?   General: Skin is warm and dry.  ?Neurological:  ?   General: No focal deficit present.  ?   Mental Status: She is alert and oriented to person, place, and time.  ?Psychiatric:     ?   Mood and Affect: Mood normal.     ?   Behavior: Behavior normal.  ? ?DG Hip Unilat With Pelvis 2-3 Views Left ? ?Result Date: 02/24/2022 ?CLINICAL DATA:  Left groin pain. EXAM: DG HIP (WITH OR WITHOUT PELVIS) 2-3V LEFT COMPARISON:  Bilateral hip x-ray 01/16/2016. FINDINGS: There is no evidence of hip fracture or dislocation. There are mild degenerative changes of both hips progressed from prior. Joint spaces are otherwise well maintained. Soft tissues are within normal limits. There also degenerative changes at L5-S1. IMPRESSION: 1. No acute bony abnormality. 2. Mild degenerative changes of both hips. Electronically Signed   By: Ronney Asters M.D.   On: 02/24/2022 18:51   ? ?Assessment and Plan :  ? ?PDMP not reviewed this encounter. ? ?1. Osteoarthritis of left hip, unspecified osteoarthritis type   ?2. Left hip pain   ?3. Chronic pain of both hips   ? ?Due to the severity of her hip pain and the fact that she has responded well to steroids recommended an oral prednisone course of 40 mg for 5 days.  Follow-up with an orthopedist soon as possible. Counseled patient on potential for adverse effects with medications prescribed/recommended today, ER and return-to-clinic  precautions discussed, patient verbalized understanding. ? ?  ?Jaynee Eagles, PA-C ?02/24/22 1910 ? ?

## 2022-07-23 ENCOUNTER — Other Ambulatory Visit (HOSPITAL_COMMUNITY): Payer: Self-pay

## 2022-07-23 MED ORDER — HYDROCODONE-ACETAMINOPHEN 10-325 MG PO TABS
1.0000 | ORAL_TABLET | Freq: Four times a day (QID) | ORAL | 0 refills | Status: DC | PRN
  Filled 2022-07-23: qty 120, 30d supply, fill #0

## 2022-08-22 ENCOUNTER — Other Ambulatory Visit (HOSPITAL_COMMUNITY): Payer: Self-pay

## 2022-08-25 ENCOUNTER — Other Ambulatory Visit (HOSPITAL_COMMUNITY): Payer: Self-pay

## 2022-08-25 MED ORDER — HYDROCODONE-ACETAMINOPHEN 10-325 MG PO TABS
1.0000 | ORAL_TABLET | Freq: Four times a day (QID) | ORAL | 0 refills | Status: DC | PRN
  Filled 2022-08-25: qty 120, 30d supply, fill #0

## 2022-09-19 ENCOUNTER — Other Ambulatory Visit (HOSPITAL_BASED_OUTPATIENT_CLINIC_OR_DEPARTMENT_OTHER): Payer: Self-pay

## 2022-09-19 MED ORDER — HYDROCODONE-ACETAMINOPHEN 10-325 MG PO TABS
1.0000 | ORAL_TABLET | Freq: Four times a day (QID) | ORAL | 0 refills | Status: AC | PRN
  Filled 2022-09-22: qty 120, 30d supply, fill #0

## 2022-09-19 MED ORDER — CLONAZEPAM 1 MG PO TABS: 1.0000 mg | ORAL_TABLET | Freq: Three times a day (TID) | ORAL | 0 refills | Status: AC

## 2022-09-22 ENCOUNTER — Other Ambulatory Visit (HOSPITAL_BASED_OUTPATIENT_CLINIC_OR_DEPARTMENT_OTHER): Payer: Self-pay

## 2022-10-20 ENCOUNTER — Other Ambulatory Visit (HOSPITAL_COMMUNITY): Payer: Self-pay

## 2022-12-03 IMAGING — MG MM DIGITAL SCREENING BILAT W/ TOMO AND CAD
8 series · 8 of 24 positions shown · non-contrast
Comparison: Previous exam(s).

CLINICAL DATA: Screening.

EXAM:
DIGITAL SCREENING BILATERAL MAMMOGRAM WITH TOMOSYNTHESIS AND CAD
TECHNIQUE: Bilateral screening digital craniocaudal and mediolateral oblique
mammograms were obtained. Bilateral screening digital breast
tomosynthesis was performed. The images were evaluated with
computer-aided detection.

[L MLO synth-2D]
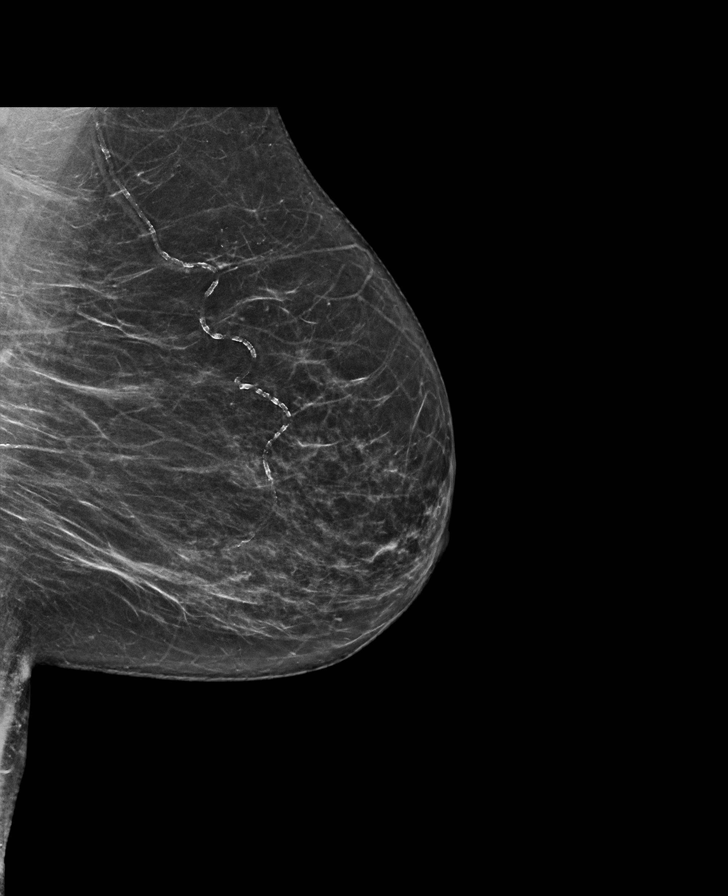

[R MLO synth-2D]
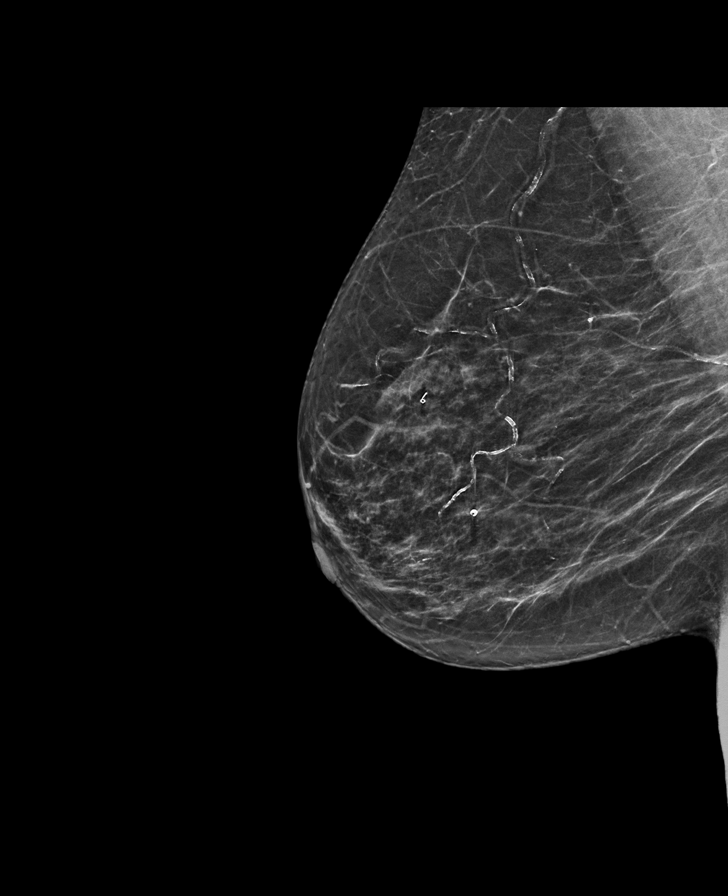

[L CC synth-2D]
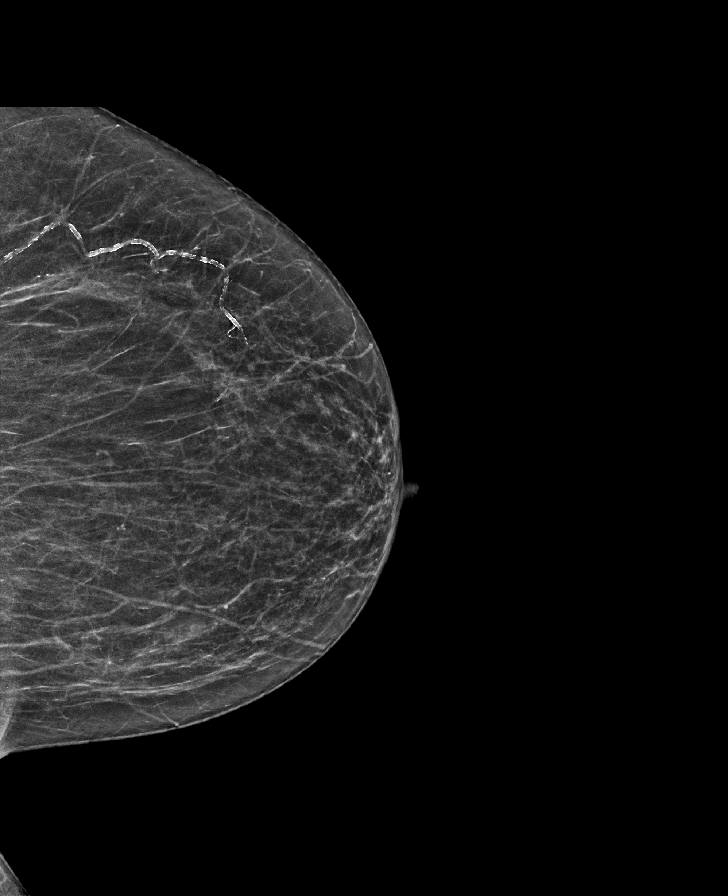

[R CC synth-2D]
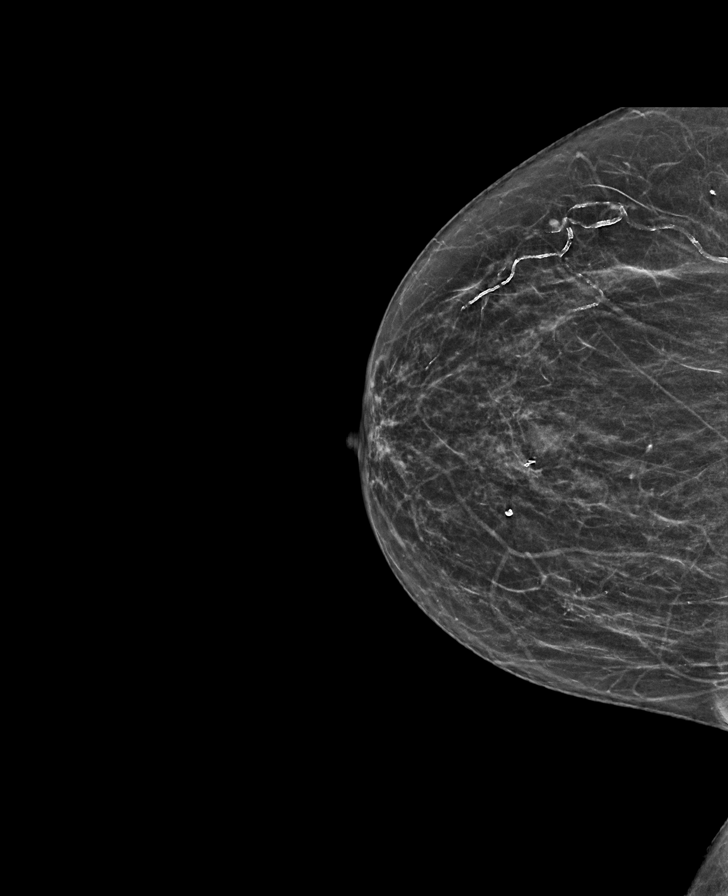

[R MLO tomo · tomo slice 32/63.0]
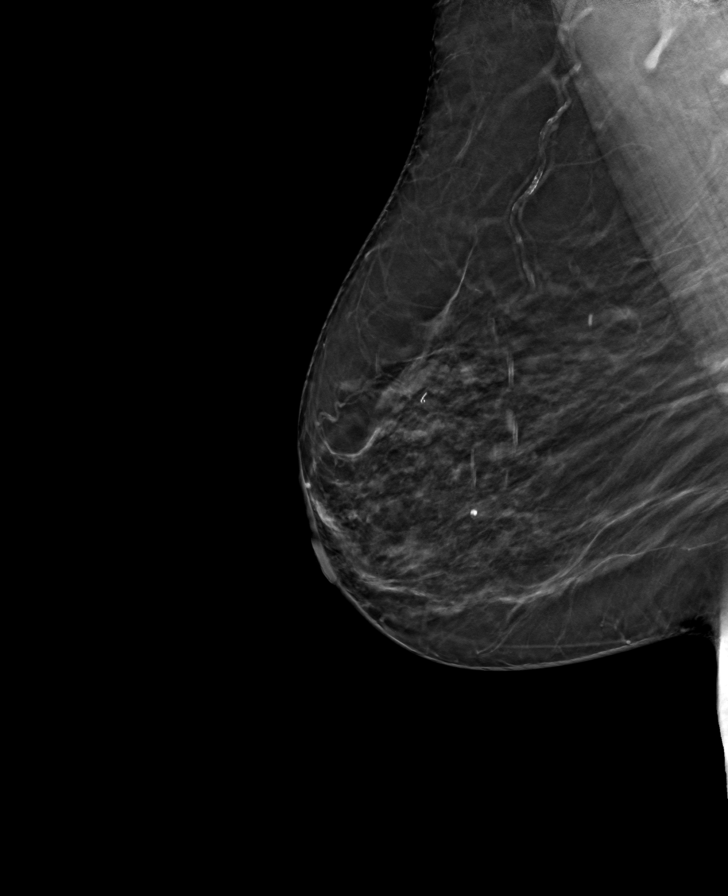

[R CC tomo · tomo slice 27/52.0]
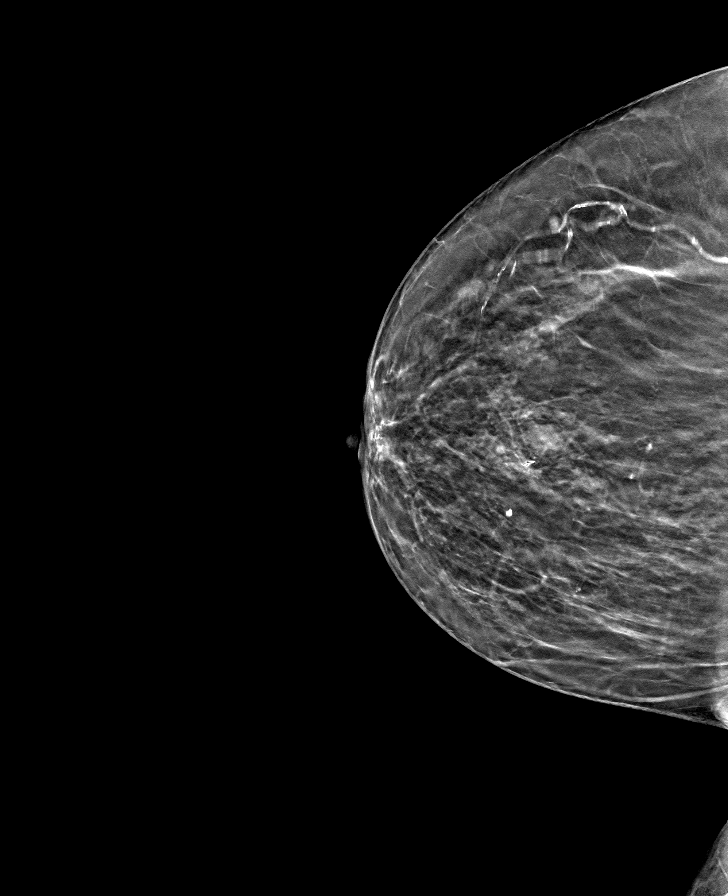

[L CC tomo · tomo slice 28/55.0]
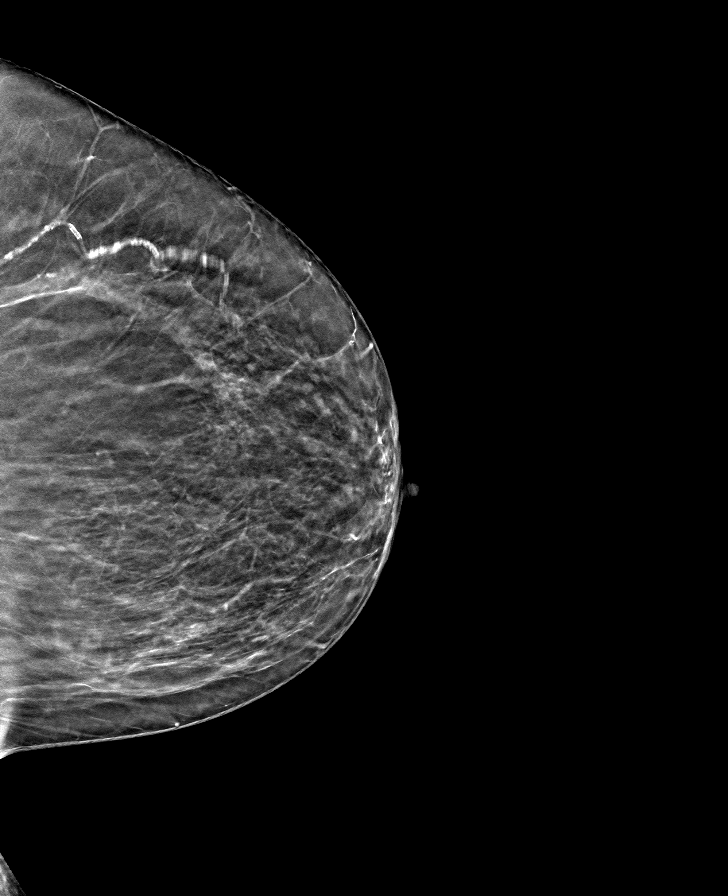

[L MLO tomo · tomo slice 33/66.0]
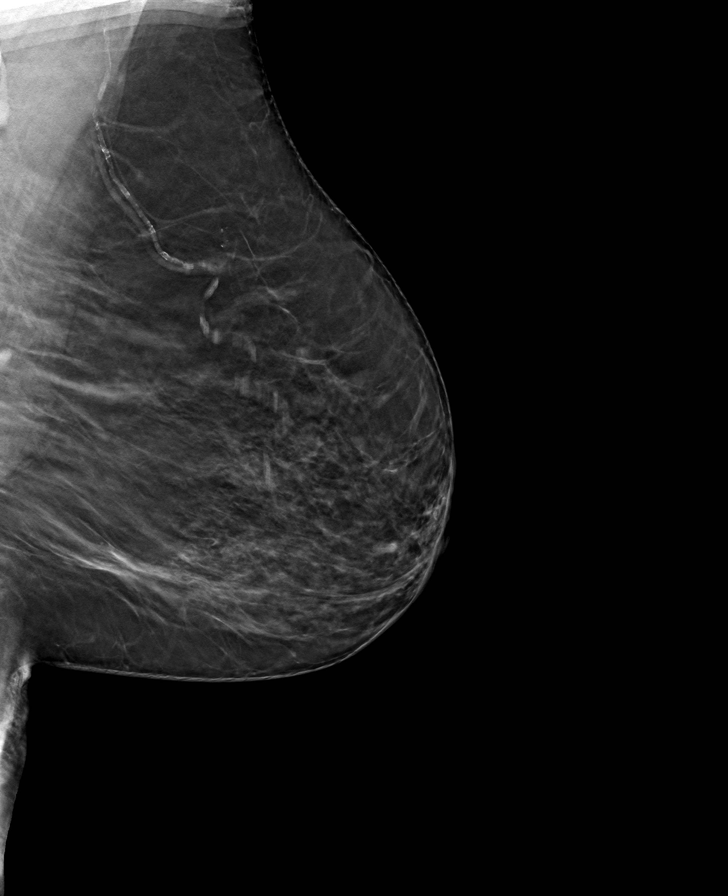

[8 of 24 positions shown; findings below may reference images not displayed]

ACR Breast Density Category b: There are scattered areas of
fibroglandular density.
FINDINGS: There are no findings suspicious for malignancy.
IMPRESSION: No mammographic evidence of malignancy. A result letter of this
screening mammogram will be mailed directly to the patient.

RECOMMENDATION:
Screening mammogram in one year. (Code:51-O-LD2)

BI-RADS CATEGORY  1: Negative.

## 2023-01-09 ENCOUNTER — Other Ambulatory Visit: Payer: Self-pay | Admitting: Registered Nurse

## 2023-01-09 DIAGNOSIS — R911 Solitary pulmonary nodule: Secondary | ICD-10-CM

## 2023-01-17 IMAGING — DX DG HIP (WITH OR WITHOUT PELVIS) 2-3V*L*
3 series · 3 of 3 positions shown · non-contrast
Comparison: Bilateral hip x-ray 01/16/2016.

CLINICAL DATA: Left groin pain.

EXAM:
DG HIP (WITH OR WITHOUT PELVIS) 2-3V LEFT

[pelvis ap]
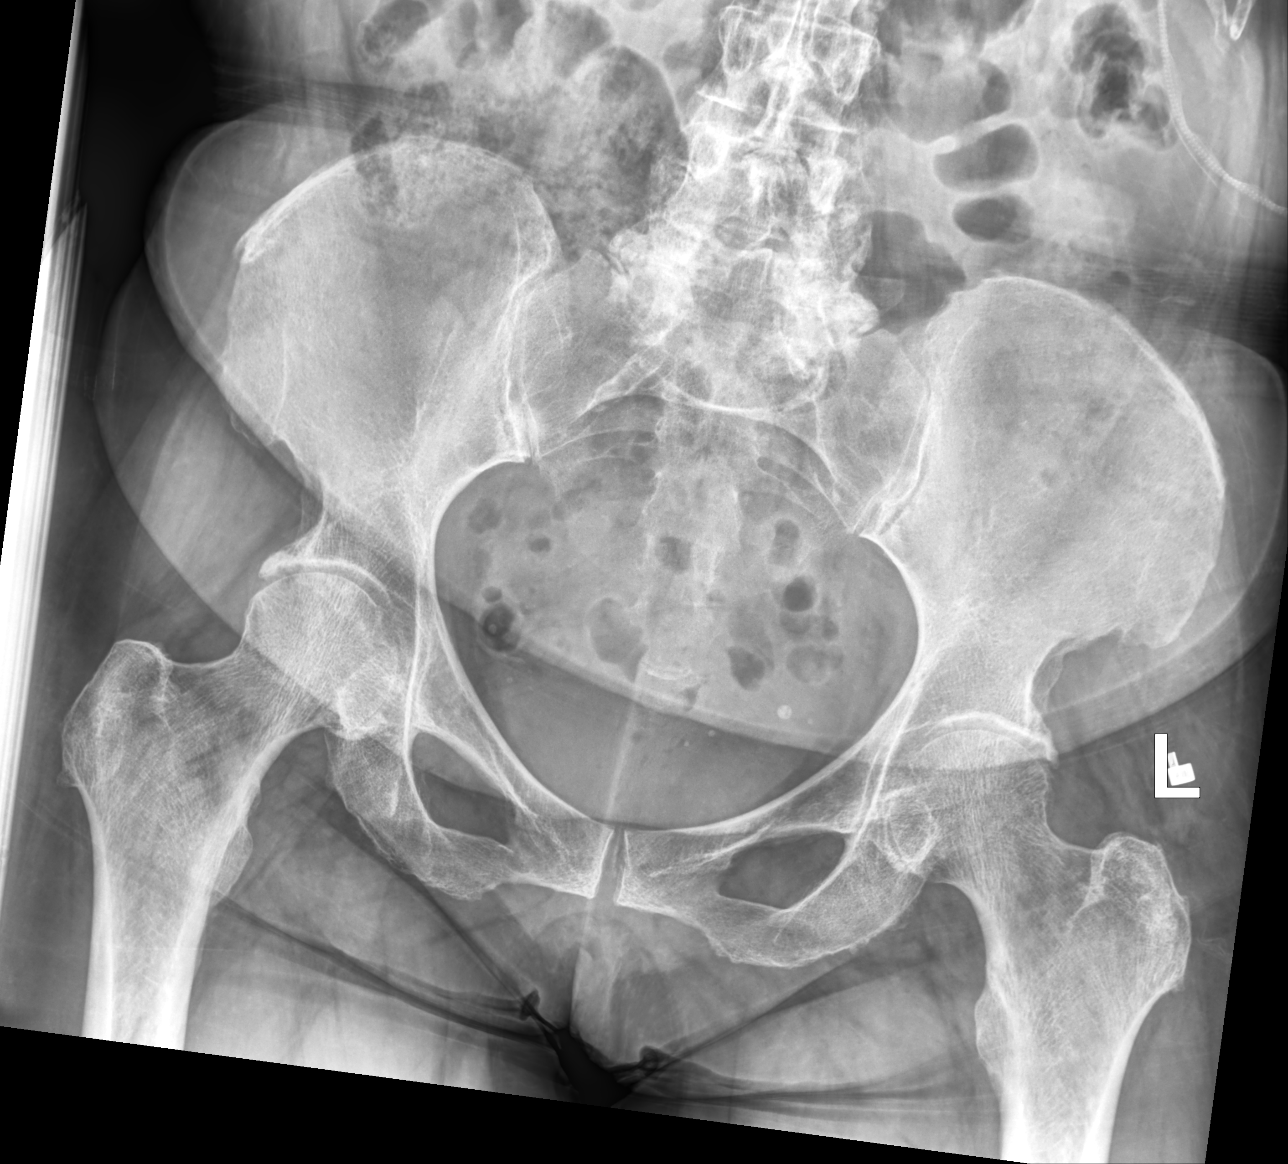

[hip joint ap]
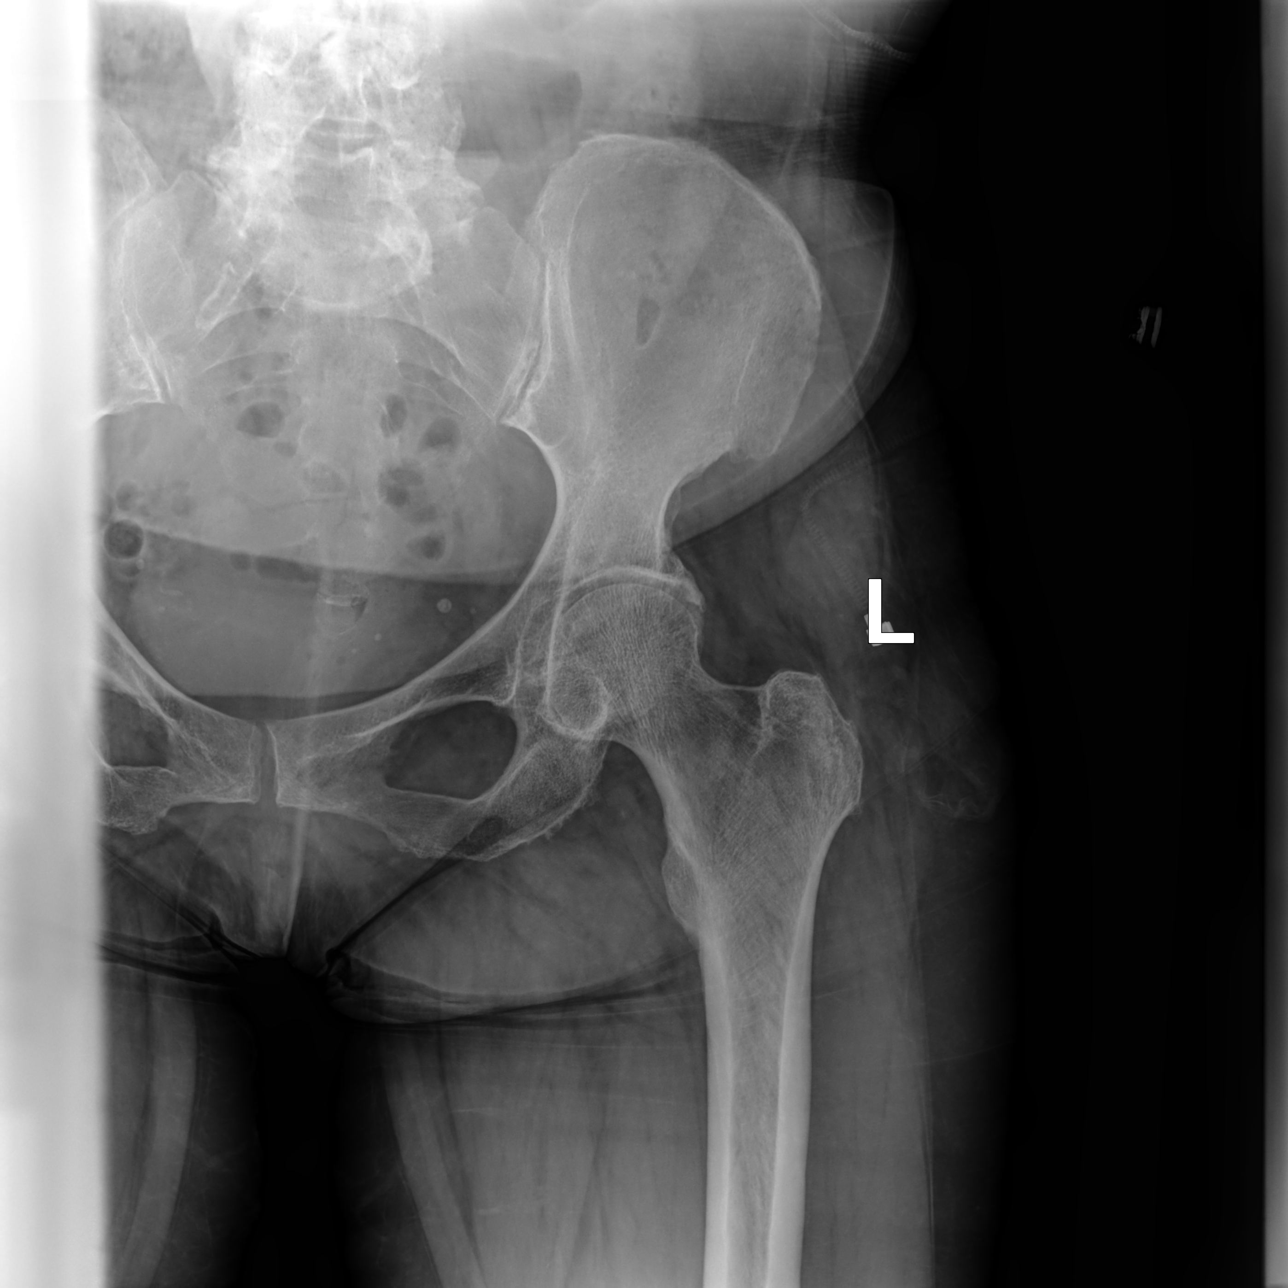

[hip joint [person_name] projection]
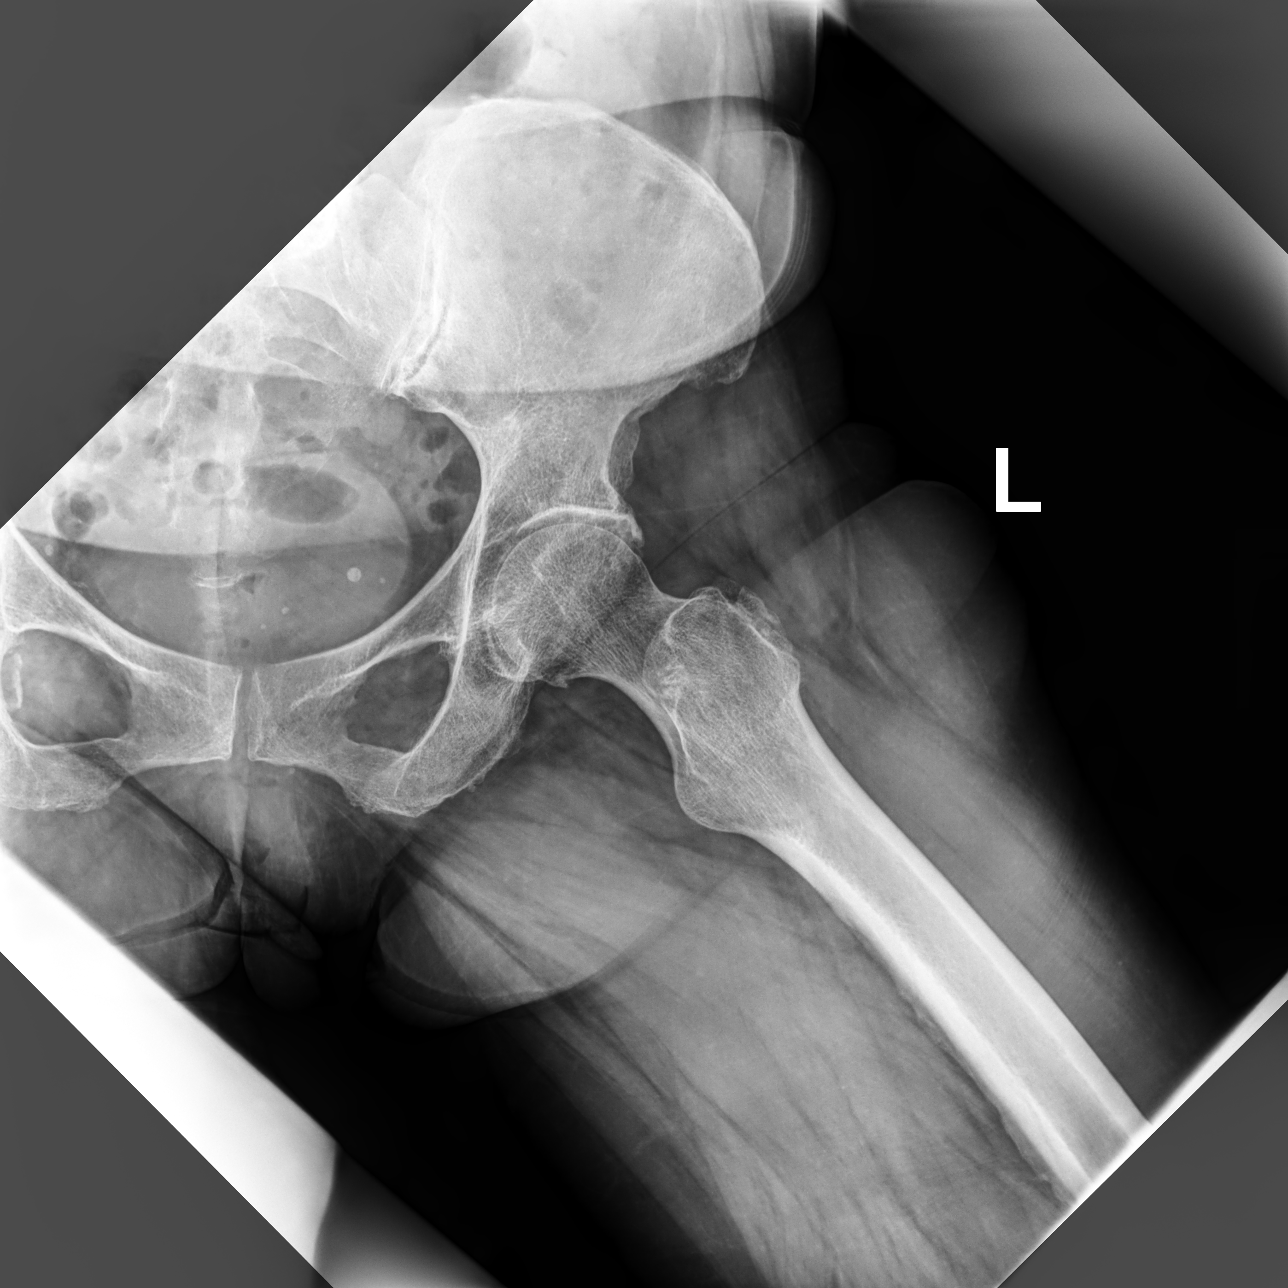

[3 of 3 positions shown; findings below may reference images not displayed]

FINDINGS: There is no evidence of hip fracture or dislocation. There are mild
degenerative changes of both hips progressed from prior. Joint
spaces are otherwise well maintained. Soft tissues are within normal
limits. There also degenerative changes at L5-S1.
IMPRESSION: 1. No acute bony abnormality.
2. Mild degenerative changes of both hips.

## 2023-01-19 ENCOUNTER — Other Ambulatory Visit: Payer: Self-pay | Admitting: Family Medicine

## 2023-01-19 DIAGNOSIS — Z1231 Encounter for screening mammogram for malignant neoplasm of breast: Secondary | ICD-10-CM

## 2023-01-20 ENCOUNTER — Ambulatory Visit: Payer: 59

## 2023-01-22 ENCOUNTER — Ambulatory Visit
Admission: RE | Admit: 2023-01-22 | Discharge: 2023-01-22 | Disposition: A | Discharge status: Home / Self Care | Payer: 59 | Source: Ambulatory Visit | Attending: Family Medicine | Admitting: Family Medicine

## 2023-01-22 DIAGNOSIS — Z1231 Encounter for screening mammogram for malignant neoplasm of breast: Secondary | ICD-10-CM

## 2023-01-23 ENCOUNTER — Other Ambulatory Visit: Payer: Self-pay | Admitting: Registered Nurse

## 2023-01-23 ENCOUNTER — Ambulatory Visit
Admission: RE | Admit: 2023-01-23 | Discharge: 2023-01-23 | Disposition: A | Discharge status: Home / Self Care | Payer: 59 | Source: Ambulatory Visit | Attending: Registered Nurse | Admitting: Registered Nurse

## 2023-01-23 DIAGNOSIS — R911 Solitary pulmonary nodule: Secondary | ICD-10-CM

## 2023-01-26 ENCOUNTER — Encounter: Payer: Self-pay | Admitting: Registered Nurse

## 2023-01-28 ENCOUNTER — Other Ambulatory Visit: Payer: Self-pay | Admitting: Registered Nurse

## 2023-01-28 DIAGNOSIS — R911 Solitary pulmonary nodule: Secondary | ICD-10-CM

## 2023-02-03 ENCOUNTER — Ambulatory Visit
Admission: RE | Admit: 2023-02-03 | Discharge: 2023-02-03 | Disposition: A | Discharge status: Home / Self Care | Payer: 59 | Source: Ambulatory Visit | Attending: Registered Nurse | Admitting: Registered Nurse

## 2023-02-03 DIAGNOSIS — R911 Solitary pulmonary nodule: Secondary | ICD-10-CM

## 2023-02-10 ENCOUNTER — Other Ambulatory Visit: Payer: Self-pay | Admitting: Registered Nurse

## 2023-02-10 DIAGNOSIS — E2839 Other primary ovarian failure: Secondary | ICD-10-CM

## 2023-03-04 ENCOUNTER — Ambulatory Visit
Admission: RE | Admit: 2023-03-04 | Discharge: 2023-03-04 | Disposition: A | Discharge status: Home / Self Care | Payer: 59 | Source: Ambulatory Visit | Attending: Registered Nurse | Admitting: Registered Nurse

## 2023-03-04 DIAGNOSIS — E2839 Other primary ovarian failure: Secondary | ICD-10-CM

## 2023-08-18 ENCOUNTER — Other Ambulatory Visit: Payer: 59

## 2024-02-03 ENCOUNTER — Encounter: Payer: Self-pay | Admitting: Internal Medicine
